# Patient Record
Sex: Female | Born: 1970 | Race: White | Hispanic: No | Marital: Married | Smoking: Current every day smoker
Health system: Southern US, Community
[De-identification: ages and names within clinical notes are randomized; demographics above are authoritative.]

## PROBLEM LIST (undated history)

## (undated) DIAGNOSIS — Z9889 Other specified postprocedural states: Secondary | ICD-10-CM

## (undated) DIAGNOSIS — R112 Nausea with vomiting, unspecified: Secondary | ICD-10-CM

## (undated) DIAGNOSIS — E669 Obesity, unspecified: Secondary | ICD-10-CM

## (undated) DIAGNOSIS — M1990 Unspecified osteoarthritis, unspecified site: Secondary | ICD-10-CM

## (undated) DIAGNOSIS — Z5189 Encounter for other specified aftercare: Secondary | ICD-10-CM

## (undated) DIAGNOSIS — E119 Type 2 diabetes mellitus without complications: Secondary | ICD-10-CM

## (undated) HISTORY — DX: Encounter for other specified aftercare: Z51.89

## (undated) HISTORY — PX: TUBAL LIGATION: SHX77

## (undated) HISTORY — PX: FRACTURE SURGERY: SHX138

## (undated) HISTORY — DX: Unspecified osteoarthritis, unspecified site: M19.90

## (undated) HISTORY — DX: Obesity, unspecified: E66.9

---
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
## 1998-02-21 DIAGNOSIS — G4700 Insomnia, unspecified: Secondary | ICD-10-CM | POA: Insufficient documentation

## 1998-02-21 DIAGNOSIS — F419 Anxiety disorder, unspecified: Secondary | ICD-10-CM | POA: Insufficient documentation

## 2012-01-30 ENCOUNTER — Other Ambulatory Visit: Payer: Self-pay | Admitting: Obstetrics & Gynecology

## 2012-01-30 ENCOUNTER — Ambulatory Visit (INDEPENDENT_AMBULATORY_CARE_PROVIDER_SITE_OTHER): Payer: 59 | Admitting: Obstetrics & Gynecology

## 2012-01-30 ENCOUNTER — Encounter: Payer: Self-pay | Admitting: Obstetrics & Gynecology

## 2012-01-30 VITALS — BP 136/86 | HR 84 | Ht 64.0 in | Wt 176.0 lb

## 2012-01-30 DIAGNOSIS — Z0000 Encounter for general adult medical examination without abnormal findings: Secondary | ICD-10-CM

## 2012-01-30 DIAGNOSIS — Z1231 Encounter for screening mammogram for malignant neoplasm of breast: Secondary | ICD-10-CM

## 2012-01-30 DIAGNOSIS — Z01419 Encounter for gynecological examination (general) (routine) without abnormal findings: Secondary | ICD-10-CM

## 2012-01-30 NOTE — Progress Notes (Signed)
Subjective:    Nancy Wood is a 41 y.o. female who presents for an annual exam. The patient has no complaints today. The patient is sexually active. GYN screening history: last pap: was normal. The patient wears seatbelts: yes. The patient participates in regular exercise: not asked. Has the patient ever been transfused or tattooed?: no. The patient reports that there is not domestic violence in her life.   Menstrual History: OB History    Grav Para Term Preterm Abortions TAB SAB Ect Mult Living   4    1  1   3       Menarche age: 60 Patient's last menstrual period was 01/25/2012.    The following portions of the patient's history were reviewed and updated as appropriate: allergies, current medications, past family history, past medical history, past social history, past surgical history and problem list.  Review of Systems A comprehensive review of systems was negative. She has been married for 16 years, monogamous for 20. She denies dyspareunia. She works at [employer].   Objective:    BP 136/86  Pulse 84  Ht 5\' 4"  (1.626 m)  Wt 176 lb (79.833 kg)  BMI 30.21 kg/m2  LMP 01/25/2012  General Appearance:    Alert, cooperative, no distress, appears stated age  Head:    Normocephalic, without obvious abnormality, atraumatic  Eyes:    PERRL, conjunctiva/corneas clear, EOM's intact, fundi    benign, both eyes  Ears:    Normal TM's and external ear canals, both ears  Nose:   Nares normal, septum midline, mucosa normal, no drainage    or sinus tenderness  Throat:   Lips, mucosa, and tongue normal; teeth and gums normal  Neck:   Supple, symmetrical, trachea midline, no adenopathy;    thyroid:  no enlargement/tenderness/nodules; no carotid   bruit or JVD  Back:     Symmetric, no curvature, ROM normal, no CVA tenderness  Lungs:     Clear to auscultation bilaterally, respirations unlabored  Chest Wall:    No tenderness or deformity   Heart:    Regular rate and rhythm, S1 and S2  normal, no murmur, rub   or gallop  Breast Exam:    No tenderness, masses, or nipple abnormality  Abdomen:     Soft, non-tender, bowel sounds active all four quadrants,    no masses, no organomegaly  Genitalia:    Normal female without lesion, discharge or tenderness, shaved, NSSA, NT, mobile, normal adnexa     Extremities:   Extremities normal, atraumatic, no cyanosis or edema  Pulses:   2+ and symmetric all extremities  Skin:   Skin color, texture, turgor normal, no rashes or lesions  Lymph nodes:   Cervical, supraclavicular, and axillary nodes normal  Neurologic:   CNII-XII intact, normal strength, sensation and reflexes    throughout  .    Assessment:    Healthy female exam.  Strong FH of breast cancer   Plan:     Mammogram. Thin prep Pap smear. BRCA testing  She declines a flu vaccine.

## 2012-02-13 LAB — PAP LB AND HPV HIGH-RISK
HPV, high-risk: NEGATIVE
PAP Smear Comment: 0

## 2012-03-05 ENCOUNTER — Ambulatory Visit: Payer: 59 | Admitting: Obstetrics & Gynecology

## 2012-03-19 ENCOUNTER — Ambulatory Visit (HOSPITAL_COMMUNITY)
Admission: RE | Admit: 2012-03-19 | Discharge: 2012-03-19 | Disposition: A | Discharge status: Home / Self Care | Payer: 59 | Source: Ambulatory Visit | Attending: Obstetrics & Gynecology | Admitting: Obstetrics & Gynecology

## 2012-03-19 DIAGNOSIS — Z0000 Encounter for general adult medical examination without abnormal findings: Secondary | ICD-10-CM

## 2012-03-19 DIAGNOSIS — Z1231 Encounter for screening mammogram for malignant neoplasm of breast: Secondary | ICD-10-CM | POA: Insufficient documentation

## 2012-04-07 ENCOUNTER — Other Ambulatory Visit: Payer: Self-pay

## 2012-12-27 ENCOUNTER — Other Ambulatory Visit: Payer: Self-pay

## 2013-12-06 ENCOUNTER — Other Ambulatory Visit: Payer: Self-pay

## 2013-12-18 LAB — LIPID PANEL
Cholesterol: 201 mg/dL — AB (ref 0–200)
HDL: 34 mg/dL — AB (ref 35–70)
LDL Cholesterol: 94 mg/dL
Triglycerides: 364 mg/dL — AB (ref 40–160)

## 2013-12-23 ENCOUNTER — Encounter: Payer: Self-pay | Admitting: Obstetrics & Gynecology

## 2013-12-26 ENCOUNTER — Other Ambulatory Visit: Payer: Self-pay | Admitting: Orthopaedic Surgery

## 2013-12-27 ENCOUNTER — Other Ambulatory Visit (HOSPITAL_COMMUNITY): Payer: Self-pay | Admitting: Orthopedic Surgery

## 2013-12-27 DIAGNOSIS — M25552 Pain in left hip: Secondary | ICD-10-CM

## 2014-01-01 ENCOUNTER — Ambulatory Visit (HOSPITAL_COMMUNITY)
Admission: RE | Admit: 2014-01-01 | Discharge: 2014-01-01 | Disposition: A | Discharge status: Home / Self Care | Payer: 59 | Source: Ambulatory Visit | Attending: Orthopedic Surgery | Admitting: Orthopedic Surgery

## 2014-01-01 ENCOUNTER — Other Ambulatory Visit (HOSPITAL_COMMUNITY): Payer: Self-pay | Admitting: Orthopedic Surgery

## 2014-01-01 DIAGNOSIS — M25552 Pain in left hip: Secondary | ICD-10-CM

## 2014-01-01 DIAGNOSIS — Z01818 Encounter for other preprocedural examination: Secondary | ICD-10-CM | POA: Diagnosis present

## 2014-01-06 ENCOUNTER — Encounter (HOSPITAL_COMMUNITY)
Admission: RE | Admit: 2014-01-06 | Discharge: 2014-01-06 | Disposition: A | Discharge status: Home / Self Care | Payer: 59 | Source: Ambulatory Visit | Attending: Orthopedic Surgery | Admitting: Orthopedic Surgery

## 2014-01-06 ENCOUNTER — Encounter (HOSPITAL_COMMUNITY): Payer: Self-pay

## 2014-01-06 ENCOUNTER — Ambulatory Visit (HOSPITAL_COMMUNITY)
Admission: RE | Admit: 2014-01-06 | Discharge: 2014-01-06 | Disposition: A | Discharge status: Home / Self Care | Payer: 59 | Source: Ambulatory Visit | Attending: Orthopedic Surgery | Admitting: Orthopedic Surgery

## 2014-01-06 DIAGNOSIS — M12552 Traumatic arthropathy, left hip: Secondary | ICD-10-CM | POA: Insufficient documentation

## 2014-01-06 DIAGNOSIS — Z01818 Encounter for other preprocedural examination: Secondary | ICD-10-CM | POA: Insufficient documentation

## 2014-01-06 HISTORY — DX: Nausea with vomiting, unspecified: R11.2

## 2014-01-06 HISTORY — DX: Other specified postprocedural states: Z98.890

## 2014-01-06 HISTORY — DX: Type 2 diabetes mellitus without complications: E11.9

## 2014-01-06 LAB — CBC WITH DIFFERENTIAL/PLATELET
BASOS ABS: 0.1 10*3/uL (ref 0.0–0.1)
Basophils Relative: 1 % (ref 0–1)
EOS PCT: 3 % (ref 0–5)
Eosinophils Absolute: 0.2 10*3/uL (ref 0.0–0.7)
HCT: 42.7 % (ref 36.0–46.0)
Hemoglobin: 14.5 g/dL (ref 12.0–15.0)
Lymphocytes Relative: 40 % (ref 12–46)
Lymphs Abs: 2.8 10*3/uL (ref 0.7–4.0)
MCH: 28.1 pg (ref 26.0–34.0)
MCHC: 34 g/dL (ref 30.0–36.0)
MCV: 82.8 fL (ref 78.0–100.0)
MONO ABS: 0.3 10*3/uL (ref 0.1–1.0)
Monocytes Relative: 4 % (ref 3–12)
NEUTROS ABS: 3.7 10*3/uL (ref 1.7–7.7)
Neutrophils Relative %: 52 % (ref 43–77)
Platelets: 284 10*3/uL (ref 150–400)
RBC: 5.16 MIL/uL — ABNORMAL HIGH (ref 3.87–5.11)
RDW: 13 % (ref 11.5–15.5)
WBC: 7 10*3/uL (ref 4.0–10.5)

## 2014-01-06 LAB — BASIC METABOLIC PANEL
ANION GAP: 17 — AB (ref 5–15)
BUN: 10 mg/dL (ref 6–23)
CALCIUM: 9.8 mg/dL (ref 8.4–10.5)
CO2: 18 mEq/L — ABNORMAL LOW (ref 19–32)
CREATININE: 0.69 mg/dL (ref 0.50–1.10)
Chloride: 102 mEq/L (ref 96–112)
GFR calc Af Amer: >90 mL/min (ref >90)
GFR calc non Af Amer: >90 mL/min (ref >90)
Glucose, Bld: 85 mg/dL (ref 70–99)
Potassium: 4.1 mEq/L (ref 3.7–5.3)
Sodium: 137 mEq/L (ref 137–147)

## 2014-01-06 LAB — APTT: aPTT: 29 seconds (ref 24–37)

## 2014-01-06 LAB — SURGICAL PCR SCREEN
MRSA, PCR: NEGATIVE
STAPHYLOCOCCUS AUREUS: POSITIVE — AB

## 2014-01-06 LAB — TYPE AND SCREEN
ABO/RH(D): A NEG
Antibody Screen: NEGATIVE

## 2014-01-06 LAB — PROTIME-INR
INR: 0.96 (ref 0.00–1.49)
PROTHROMBIN TIME: 12.9 s (ref 11.6–15.2)

## 2014-01-06 LAB — HCG, SERUM, QUALITATIVE: PREG SERUM: NEGATIVE

## 2014-01-06 LAB — ABO/RH: ABO/RH(D): A NEG

## 2014-01-06 NOTE — Progress Notes (Signed)
I called a prescription for Mupirocin ointment to [facility].

## 2014-01-07 LAB — URINE CULTURE
COLONY COUNT: NO GROWTH
CULTURE: NO GROWTH
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
## 2014-01-12 DIAGNOSIS — M12552 Traumatic arthropathy, left hip: Secondary | ICD-10-CM | POA: Diagnosis present

## 2014-01-12 MED ORDER — KCL IN DEXTROSE-NACL 20-5-0.2 MEQ/L-%-% IV SOLN
INTRAVENOUS | Status: DC
  Filled 2014-01-12 (×2): qty 1000

## 2014-01-12 MED ORDER — CEFAZOLIN SODIUM-DEXTROSE 2-3 GM-% IV SOLR
2.0000 g | INTRAVENOUS | Status: DC
  Filled 2014-01-12: qty 50

## 2014-01-13 ENCOUNTER — Encounter (HOSPITAL_COMMUNITY): Payer: Self-pay | Admitting: *Deleted

## 2014-01-13 ENCOUNTER — Encounter (HOSPITAL_COMMUNITY)
Admission: RE | Disposition: A | Discharge status: Home / Self Care | Payer: Self-pay | Source: Ambulatory Visit | Attending: Orthopedic Surgery

## 2014-01-13 ENCOUNTER — Inpatient Hospital Stay (HOSPITAL_COMMUNITY): Payer: 59

## 2014-01-13 ENCOUNTER — Inpatient Hospital Stay (HOSPITAL_COMMUNITY): Payer: 59 | Admitting: Anesthesiology

## 2014-01-13 ENCOUNTER — Inpatient Hospital Stay (HOSPITAL_COMMUNITY)
Admission: RE | Admit: 2014-01-13 | Discharge: 2014-01-15 | DRG: 470 | Disposition: A | Discharge status: Home / Self Care | Payer: 59 | Source: Ambulatory Visit | Attending: Orthopedic Surgery | Admitting: Orthopedic Surgery

## 2014-01-13 DIAGNOSIS — E669 Obesity, unspecified: Secondary | ICD-10-CM | POA: Diagnosis present

## 2014-01-13 DIAGNOSIS — M12552 Traumatic arthropathy, left hip: Secondary | ICD-10-CM | POA: Diagnosis present

## 2014-01-13 DIAGNOSIS — Z6830 Body mass index (BMI) 30.0-30.9, adult: Secondary | ICD-10-CM | POA: Diagnosis not present

## 2014-01-13 DIAGNOSIS — E119 Type 2 diabetes mellitus without complications: Secondary | ICD-10-CM | POA: Diagnosis present

## 2014-01-13 DIAGNOSIS — Z720 Tobacco use: Secondary | ICD-10-CM | POA: Diagnosis not present

## 2014-01-13 DIAGNOSIS — Z833 Family history of diabetes mellitus: Secondary | ICD-10-CM | POA: Diagnosis not present

## 2014-01-13 DIAGNOSIS — F419 Anxiety disorder, unspecified: Secondary | ICD-10-CM | POA: Diagnosis present

## 2014-01-13 DIAGNOSIS — Z808 Family history of malignant neoplasm of other organs or systems: Secondary | ICD-10-CM

## 2014-01-13 DIAGNOSIS — M1652 Unilateral post-traumatic osteoarthritis, left hip: Secondary | ICD-10-CM | POA: Diagnosis present

## 2014-01-13 DIAGNOSIS — M12559 Traumatic arthropathy, unspecified hip: Secondary | ICD-10-CM | POA: Diagnosis present

## 2014-01-13 DIAGNOSIS — Z803 Family history of malignant neoplasm of breast: Secondary | ICD-10-CM | POA: Diagnosis not present

## 2014-01-13 DIAGNOSIS — M1612 Unilateral primary osteoarthritis, left hip: Secondary | ICD-10-CM

## 2014-01-13 HISTORY — PX: TOTAL HIP ARTHROPLASTY: SHX124

## 2014-01-13 LAB — GLUCOSE, CAPILLARY
GLUCOSE-CAPILLARY: 221 mg/dL — AB (ref 70–99)
GLUCOSE-CAPILLARY: 136 mg/dL — AB (ref 70–99)
Glucose-Capillary: 126 mg/dL — ABNORMAL HIGH (ref 70–99)
Glucose-Capillary: 126 mg/dL — ABNORMAL HIGH (ref 70–99)
Glucose-Capillary: 213 mg/dL — ABNORMAL HIGH (ref 70–99)

## 2014-01-13 LAB — HEMOGLOBIN A1C
Hgb A1c MFr Bld: 6.3 % — ABNORMAL HIGH (ref <5.7)
MEAN PLASMA GLUCOSE: 134 mg/dL — AB (ref <117)

## 2014-01-13 SURGERY — ARTHROPLASTY, HIP, TOTAL,POSTERIOR APPROACH
Anesthesia: Monitor Anesthesia Care | Site: Hip | Laterality: Left

## 2014-01-13 MED ORDER — OXYCODONE HCL 5 MG PO TABS: 5.0000 mg | ORAL_TABLET | Freq: Once | ORAL | Status: DC | PRN

## 2014-01-13 MED ORDER — OXYCODONE HCL 5 MG PO TABS
5.0000 mg | ORAL_TABLET | ORAL | Status: DC | PRN
  Administered 2014-01-13 – 2014-01-15 (×13): 10 mg via ORAL
  Filled 2014-01-13 (×13): qty 2

## 2014-01-13 MED ORDER — BUPIVACAINE HCL (PF) 0.5 % IJ SOLN
INTRAMUSCULAR | Status: DC | PRN
  Administered 2014-01-13: 3 mL

## 2014-01-13 MED ORDER — ONDANSETRON HCL 4 MG/2ML IJ SOLN
INTRAMUSCULAR | Status: DC | PRN
  Administered 2014-01-13: 4 mg via INTRAVENOUS

## 2014-01-13 MED ORDER — PROPOFOL 10 MG/ML IV BOLUS
INTRAVENOUS | Status: AC
  Filled 2014-01-13: qty 20

## 2014-01-13 MED ORDER — CEFUROXIME SODIUM 1.5 G IJ SOLR
INTRAMUSCULAR | Status: AC
  Filled 2014-01-13: qty 1.5

## 2014-01-13 MED ORDER — PROPOFOL INFUSION 10 MG/ML OPTIME
INTRAVENOUS | Status: DC | PRN
  Administered 2014-01-13 (×2): via INTRAVENOUS
  Administered 2014-01-13: 75 ug/kg/min via INTRAVENOUS

## 2014-01-13 MED ORDER — PHENOL 1.4 % MT LIQD: 1.0000 | OROMUCOSAL | Status: DC | PRN

## 2014-01-13 MED ORDER — DEXTROSE 5 % IV SOLN
2.0000 g | Freq: Two times a day (BID) | INTRAVENOUS | Status: DC
  Filled 2014-01-13: qty 2

## 2014-01-13 MED ORDER — EPHEDRINE SULFATE 50 MG/ML IJ SOLN
INTRAMUSCULAR | Status: AC
  Filled 2014-01-13: qty 1

## 2014-01-13 MED ORDER — METHOCARBAMOL 1000 MG/10ML IJ SOLN
500.0000 mg | Freq: Four times a day (QID) | INTRAVENOUS | Status: DC | PRN
  Filled 2014-01-13: qty 5

## 2014-01-13 MED ORDER — ONDANSETRON HCL 4 MG/2ML IJ SOLN
INTRAMUSCULAR | Status: AC
  Filled 2014-01-13: qty 2

## 2014-01-13 MED ORDER — PHENYLEPHRINE HCL 10 MG/ML IJ SOLN
10.0000 mg | INTRAVENOUS | Status: DC | PRN
  Administered 2014-01-13: 20 ug/min via INTRAVENOUS

## 2014-01-13 MED ORDER — KCL IN DEXTROSE-NACL 20-5-0.45 MEQ/L-%-% IV SOLN
INTRAVENOUS | Status: DC
  Administered 2014-01-13: 16:00:00 via INTRAVENOUS
  Filled 2014-01-13 (×8): qty 1000

## 2014-01-13 MED ORDER — METHOCARBAMOL 500 MG PO TABS
500.0000 mg | ORAL_TABLET | Freq: Two times a day (BID) | ORAL | Status: DC
Start: 2014-01-13 — End: 2014-11-26

## 2014-01-13 MED ORDER — LACTATED RINGERS IV SOLN
INTRAVENOUS | Status: DC | PRN
  Administered 2014-01-13 (×3): via INTRAVENOUS

## 2014-01-13 MED ORDER — BISACODYL 5 MG PO TBEC: 5.0000 mg | DELAYED_RELEASE_TABLET | Freq: Every day | ORAL | Status: DC | PRN

## 2014-01-13 MED ORDER — FENTANYL CITRATE 0.05 MG/ML IJ SOLN
INTRAMUSCULAR | Status: AC
  Filled 2014-01-13: qty 5

## 2014-01-13 MED ORDER — SODIUM CHLORIDE 0.9 % IR SOLN
Status: DC | PRN
  Administered 2014-01-13: 1000 mL

## 2014-01-13 MED ORDER — METFORMIN HCL ER 500 MG PO TB24
500.0000 mg | ORAL_TABLET | Freq: Every day | ORAL | Status: DC
  Administered 2014-01-13 – 2014-01-15 (×3): 500 mg via ORAL
  Filled 2014-01-13 (×3): qty 1

## 2014-01-13 MED ORDER — PROPOFOL 10 MG/ML IV BOLUS
INTRAVENOUS | Status: DC | PRN
  Administered 2014-01-13: 20 mg via INTRAVENOUS

## 2014-01-13 MED ORDER — HYDROMORPHONE HCL 1 MG/ML IJ SOLN
1.0000 mg | INTRAMUSCULAR | Status: DC | PRN
  Administered 2014-01-13: 1 mg via INTRAVENOUS
  Filled 2014-01-13: qty 1

## 2014-01-13 MED ORDER — SUCCINYLCHOLINE CHLORIDE 20 MG/ML IJ SOLN
INTRAMUSCULAR | Status: AC
  Filled 2014-01-13: qty 1

## 2014-01-13 MED ORDER — SODIUM CHLORIDE 0.9 % IJ SOLN
INTRAMUSCULAR | Status: AC
  Filled 2014-01-13: qty 10

## 2014-01-13 MED ORDER — KETOROLAC TROMETHAMINE 30 MG/ML IJ SOLN: 15.0000 mg | Freq: Once | INTRAMUSCULAR | Status: DC | PRN

## 2014-01-13 MED ORDER — MIDAZOLAM HCL 2 MG/2ML IJ SOLN
INTRAMUSCULAR | Status: AC
  Filled 2014-01-13: qty 2

## 2014-01-13 MED ORDER — HYDROMORPHONE HCL 1 MG/ML IJ SOLN: 0.2500 mg | INTRAMUSCULAR | Status: DC | PRN

## 2014-01-13 MED ORDER — OXYCODONE HCL 5 MG/5ML PO SOLN: 5.0000 mg | Freq: Once | ORAL | Status: DC | PRN

## 2014-01-13 MED ORDER — METOCLOPRAMIDE HCL 5 MG/ML IJ SOLN
5.0000 mg | Freq: Three times a day (TID) | INTRAMUSCULAR | Status: DC | PRN
  Administered 2014-01-14: 10 mg via INTRAVENOUS
  Filled 2014-01-13: qty 2

## 2014-01-13 MED ORDER — DOCUSATE SODIUM 100 MG PO CAPS
100.0000 mg | ORAL_CAPSULE | Freq: Two times a day (BID) | ORAL | Status: DC
  Administered 2014-01-13 – 2014-01-14 (×3): 100 mg via ORAL
  Filled 2014-01-13 (×5): qty 1

## 2014-01-13 MED ORDER — METHOCARBAMOL 500 MG PO TABS
500.0000 mg | ORAL_TABLET | Freq: Four times a day (QID) | ORAL | Status: DC | PRN
  Administered 2014-01-13 – 2014-01-15 (×6): 500 mg via ORAL
  Filled 2014-01-13 (×7): qty 1

## 2014-01-13 MED ORDER — ACETAMINOPHEN 650 MG RE SUPP: 650.0000 mg | Freq: Four times a day (QID) | RECTAL | Status: DC | PRN

## 2014-01-13 MED ORDER — MENTHOL 3 MG MT LOZG: 1.0000 | LOZENGE | OROMUCOSAL | Status: DC | PRN

## 2014-01-13 MED ORDER — CEFAZOLIN SODIUM-DEXTROSE 2-3 GM-% IV SOLR
2.0000 g | Freq: Once | INTRAVENOUS | Status: AC
  Administered 2014-01-13: 2 g via INTRAVENOUS

## 2014-01-13 MED ORDER — BUPIVACAINE-EPINEPHRINE (PF) 0.5% -1:200000 IJ SOLN
INTRAMUSCULAR | Status: AC
  Filled 2014-01-13: qty 30

## 2014-01-13 MED ORDER — ONDANSETRON HCL 4 MG/2ML IJ SOLN
4.0000 mg | Freq: Four times a day (QID) | INTRAMUSCULAR | Status: DC | PRN
  Administered 2014-01-13 – 2014-01-14 (×2): 4 mg via INTRAVENOUS
  Filled 2014-01-13 (×2): qty 2

## 2014-01-13 MED ORDER — ACETAMINOPHEN 325 MG PO TABS: 650.0000 mg | ORAL_TABLET | Freq: Four times a day (QID) | ORAL | Status: DC | PRN

## 2014-01-13 MED ORDER — SENNOSIDES-DOCUSATE SODIUM 8.6-50 MG PO TABS: 1.0000 | ORAL_TABLET | Freq: Every evening | ORAL | Status: DC | PRN

## 2014-01-13 MED ORDER — PHENYLEPHRINE HCL 10 MG/ML IJ SOLN
INTRAMUSCULAR | Status: DC | PRN
  Administered 2014-01-13 (×2): 80 ug via INTRAVENOUS
  Administered 2014-01-13 (×2): 40 ug via INTRAVENOUS

## 2014-01-13 MED ORDER — PHENYLEPHRINE HCL 10 MG/ML IJ SOLN
INTRAMUSCULAR | Status: AC
  Filled 2014-01-13: qty 1

## 2014-01-13 MED ORDER — MAGNESIUM CITRATE PO SOLN: 1.0000 | Freq: Once | ORAL | Status: AC | PRN

## 2014-01-13 MED ORDER — BUPIVACAINE-EPINEPHRINE 0.5% -1:200000 IJ SOLN
INTRAMUSCULAR | Status: DC | PRN
  Administered 2014-01-13: 20 mL

## 2014-01-13 MED ORDER — OXYCODONE-ACETAMINOPHEN 5-325 MG PO TABS: 1.0000 | ORAL_TABLET | ORAL | Status: DC | PRN

## 2014-01-13 MED ORDER — DEXAMETHASONE SODIUM PHOSPHATE 4 MG/ML IJ SOLN
INTRAMUSCULAR | Status: AC
  Filled 2014-01-13: qty 1

## 2014-01-13 MED ORDER — FENTANYL CITRATE 0.05 MG/ML IJ SOLN
INTRAMUSCULAR | Status: DC | PRN
  Administered 2014-01-13: 50 ug via INTRAVENOUS

## 2014-01-13 MED ORDER — TRANEXAMIC ACID 100 MG/ML IV SOLN
1000.0000 mg | INTRAVENOUS | Status: AC
  Administered 2014-01-13: 1000 mg via INTRAVENOUS
  Filled 2014-01-13: qty 10

## 2014-01-13 MED ORDER — ASPIRIN EC 325 MG PO TBEC: 325.0000 mg | DELAYED_RELEASE_TABLET | Freq: Two times a day (BID) | ORAL | Status: DC

## 2014-01-13 MED ORDER — ASPIRIN EC 325 MG PO TBEC
325.0000 mg | DELAYED_RELEASE_TABLET | Freq: Every day | ORAL | Status: DC
  Administered 2014-01-14 – 2014-01-15 (×2): 325 mg via ORAL
  Filled 2014-01-13 (×3): qty 1

## 2014-01-13 MED ORDER — METOCLOPRAMIDE HCL 10 MG PO TABS: 5.0000 mg | ORAL_TABLET | Freq: Three times a day (TID) | ORAL | Status: DC | PRN

## 2014-01-13 MED ORDER — ONDANSETRON HCL 4 MG PO TABS: 4.0000 mg | ORAL_TABLET | Freq: Four times a day (QID) | ORAL | Status: DC | PRN

## 2014-01-13 MED ORDER — MIDAZOLAM HCL 5 MG/5ML IJ SOLN
INTRAMUSCULAR | Status: DC | PRN
  Administered 2014-01-13: 2 mg via INTRAVENOUS

## 2014-01-13 MED ORDER — LIDOCAINE HCL (CARDIAC) 20 MG/ML IV SOLN
INTRAVENOUS | Status: AC
  Filled 2014-01-13: qty 5

## 2014-01-13 MED ORDER — PHENYLEPHRINE 40 MCG/ML (10ML) SYRINGE FOR IV PUSH (FOR BLOOD PRESSURE SUPPORT)
PREFILLED_SYRINGE | INTRAVENOUS | Status: AC
  Filled 2014-01-13: qty 10

## 2014-01-13 MED ORDER — DIPHENHYDRAMINE HCL 12.5 MG/5ML PO ELIX: 12.5000 mg | ORAL_SOLUTION | ORAL | Status: DC | PRN

## 2014-01-13 MED ORDER — ALPRAZOLAM 0.5 MG PO TABS: 1.0000 mg | ORAL_TABLET | Freq: Every evening | ORAL | Status: DC | PRN

## 2014-01-13 MED ORDER — GLYCOPYRROLATE 0.2 MG/ML IJ SOLN
INTRAMUSCULAR | Status: AC
  Filled 2014-01-13: qty 1

## 2014-01-13 MED ORDER — DEXAMETHASONE SODIUM PHOSPHATE 4 MG/ML IJ SOLN
INTRAMUSCULAR | Status: DC | PRN
  Administered 2014-01-13: 4 mg via INTRAVENOUS

## 2014-01-13 MED ORDER — LIDOCAINE HCL (CARDIAC) 20 MG/ML IV SOLN
INTRAVENOUS | Status: DC | PRN
  Administered 2014-01-13 (×2): 50 mg via INTRAVENOUS

## 2014-01-13 MED ORDER — ROCURONIUM BROMIDE 50 MG/5ML IV SOLN
INTRAVENOUS | Status: AC
  Filled 2014-01-13: qty 1

## 2014-01-13 SURGICAL SUPPLY — 55 items
BLADE SAW SGTL 18X1.27X75 (BLADE) ×2 IMPLANT
BRUSH FEMORAL CANAL (MISCELLANEOUS) IMPLANT
CAPT HIP PF COP ×1 IMPLANT
COVER BACK TABLE 24X17X13 BIG (DRAPES) IMPLANT
COVER SURGICAL LIGHT HANDLE (MISCELLANEOUS) ×4 IMPLANT
DRAPE IMP U-DRAPE 54X76 (DRAPES) ×2 IMPLANT
DRAPE ORTHO SPLIT 77X108 STRL (DRAPES) ×2
DRAPE PROXIMA HALF (DRAPES) ×2 IMPLANT
DRAPE SURG ORHT 6 SPLT 77X108 (DRAPES) ×1 IMPLANT
DRAPE U-SHAPE 47X51 STRL (DRAPES) ×2 IMPLANT
DRILL BIT 7/64X5 (BIT) ×2 IMPLANT
DRSG AQUACEL AG ADV 3.5X10 (GAUZE/BANDAGES/DRESSINGS) ×2 IMPLANT
DRSG MEPILEX BORDER 4X12 (GAUZE/BANDAGES/DRESSINGS) ×1 IMPLANT
DURAPREP 26ML APPLICATOR (WOUND CARE) ×2 IMPLANT
ELECT BLADE 4.0 EZ CLEAN MEGAD (MISCELLANEOUS)
ELECT REM PT RETURN 9FT ADLT (ELECTROSURGICAL) ×2
ELECTRODE BLDE 4.0 EZ CLN MEGD (MISCELLANEOUS) IMPLANT
ELECTRODE REM PT RTRN 9FT ADLT (ELECTROSURGICAL) ×1 IMPLANT
GAUZE XEROFORM 1X8 LF (GAUZE/BANDAGES/DRESSINGS) ×2 IMPLANT
GLOVE BIO SURGEON STRL SZ7.5 (GLOVE) ×2 IMPLANT
GLOVE BIO SURGEON STRL SZ8.5 (GLOVE) ×4 IMPLANT
GLOVE BIOGEL PI IND STRL 8 (GLOVE) ×2 IMPLANT
GLOVE BIOGEL PI IND STRL 9 (GLOVE) ×1 IMPLANT
GLOVE BIOGEL PI INDICATOR 8 (GLOVE) ×2
GLOVE BIOGEL PI INDICATOR 9 (GLOVE) ×1
GOWN STRL REUS W/ TWL LRG LVL3 (GOWN DISPOSABLE) ×2 IMPLANT
GOWN STRL REUS W/ TWL XL LVL3 (GOWN DISPOSABLE) ×3 IMPLANT
GOWN STRL REUS W/TWL LRG LVL3 (GOWN DISPOSABLE) ×4
GOWN STRL REUS W/TWL XL LVL3 (GOWN DISPOSABLE) ×6
HANDPIECE INTERPULSE COAX TIP (DISPOSABLE)
HOOD PEEL AWAY FACE SHEILD DIS (HOOD) ×4 IMPLANT
KIT BASIN OR (CUSTOM PROCEDURE TRAY) ×2 IMPLANT
KIT ROOM TURNOVER OR (KITS) ×2 IMPLANT
MANIFOLD NEPTUNE II (INSTRUMENTS) ×2 IMPLANT
NEEDLE 22X1 1/2 (OR ONLY) (NEEDLE) ×2 IMPLANT
NS IRRIG 1000ML POUR BTL (IV SOLUTION) ×2 IMPLANT
PACK TOTAL JOINT (CUSTOM PROCEDURE TRAY) ×2 IMPLANT
PACK UNIVERSAL I (CUSTOM PROCEDURE TRAY) ×2 IMPLANT
PAD ARMBOARD 7.5X6 YLW CONV (MISCELLANEOUS) ×4 IMPLANT
PASSER SUT SWANSON 36MM LOOP (INSTRUMENTS) ×2 IMPLANT
PRESSURIZER FEMORAL UNIV (MISCELLANEOUS) IMPLANT
SET HNDPC FAN SPRY TIP SCT (DISPOSABLE) IMPLANT
SUT ETHIBOND 2 V 37 (SUTURE) ×2 IMPLANT
SUT VIC AB 0 CTB1 27 (SUTURE) ×2 IMPLANT
SUT VIC AB 1 CTX 36 (SUTURE) ×2
SUT VIC AB 1 CTX36XBRD ANBCTR (SUTURE) ×1 IMPLANT
SUT VIC AB 2-0 CTB1 (SUTURE) ×2 IMPLANT
SUT VIC AB 3-0 SH 27 (SUTURE) ×2
SUT VIC AB 3-0 SH 27X BRD (SUTURE) ×1 IMPLANT
SYR CONTROL 10ML LL (SYRINGE) ×2 IMPLANT
TOWEL OR 17X24 6PK STRL BLUE (TOWEL DISPOSABLE) ×2 IMPLANT
TOWEL OR 17X26 10 PK STRL BLUE (TOWEL DISPOSABLE) ×2 IMPLANT
TOWER CARTRIDGE SMART MIX (DISPOSABLE) IMPLANT
TRAY FOLEY CATH 14FR (SET/KITS/TRAYS/PACK) IMPLANT
WATER STERILE IRR 1000ML POUR (IV SOLUTION) ×8 IMPLANT
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
## 2014-01-13 NOTE — Plan of Care (Signed)
Problem: Phase I Progression Outcomes Goal: Dangle or out of bed evening of surgery Outcome: Completed/Met Date Met:  01/13/14

## 2014-01-13 NOTE — Transfer of Care (Signed)
Immediate Anesthesia Transfer of Care Note  Patient: Nancy Wood  Procedure(s) Performed: Procedure(s): LEFT TOTAL HIP ARTHROPLASTY/REMOVE PLATE AND SCREWS OF LEFT HIP (Left)  Patient Location: PACU  Anesthesia Type:MAC and Spinal  Level of Consciousness: awake, oriented and patient cooperative  Airway & Oxygen Therapy: Patient Spontanous Breathing  Post-op Assessment: Report given to PACU RN and Post -op Vital signs reviewed and stable  Post vital signs: Reviewed  Complications: No apparent anesthesia complications
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
## 2014-01-13 NOTE — Anesthesia Postprocedure Evaluation (Signed)
  Anesthesia Post-op Note  Patient: Nancy Wood  Procedure(s) Performed: Procedure(s): LEFT TOTAL HIP ARTHROPLASTY/REMOVE PLATE AND SCREWS OF LEFT HIP (Left)  Patient Location: PACU  Anesthesia Type:MAC and Spinal  Level of Consciousness: awake  Airway and Oxygen Therapy: Patient Spontanous Breathing  Post-op Pain: none  Post-op Assessment: Post-op Vital signs reviewed, Patient's Cardiovascular Status Stable, Respiratory Function Stable, Patent Airway, No signs of Nausea or vomiting and Pain level controlled  Post-op Vital Signs: Reviewed and stable  Last Vitals:  Filed Vitals:   01/13/14 1130  BP: 102/73  Pulse: 57  Temp: 36.7 C  Resp: 14    Complications: No apparent anesthesia complications

## 2014-01-13 NOTE — Anesthesia Preprocedure Evaluation (Addendum)
Anesthesia Evaluation  Patient identified by MRN, date of birth, ID band Patient awake    Reviewed: Allergy & Precautions, H&P , NPO status , Patient's Chart, lab work & pertinent test results  History of Anesthesia Complications Negative for: history of anesthetic complications  Airway Mallampati: II  TM Distance: >3 FB Neck ROM: Full    Dental  (+) Teeth Intact, Dental Advisory Given   Pulmonary neg sleep apnea, neg COPDneg recent URI, Current Smoker,          Cardiovascular negative cardio ROS  Rhythm:Regular Rate:Normal     Neuro/Psych PSYCHIATRIC DISORDERS Anxiety negative neurological ROS     GI/Hepatic negative GI ROS, Neg liver ROS,   Endo/Other  diabetes  Renal/GU negative Renal ROS     Musculoskeletal  (+) Arthritis -,   Abdominal   Peds  Hematology negative hematology ROS (+)   Anesthesia Other Findings   Reproductive/Obstetrics                            Anesthesia Physical Anesthesia Plan  ASA: II  Anesthesia Plan: MAC and Spinal   Post-op Pain Management:    Induction: Intravenous  Airway Management Planned: Natural Airway and Nasal Cannula  Additional Equipment: None  Intra-op Plan:   Post-operative Plan:   Informed Consent: I have reviewed the patients History and Physical, chart, labs and discussed the procedure including the risks, benefits and alternatives for the proposed anesthesia with the patient or authorized representative who has indicated his/her understanding and acceptance.   Dental advisory given  Plan Discussed with: CRNA and Surgeon  Anesthesia Plan Comments:         Anesthesia Quick Evaluation

## 2014-01-13 NOTE — Plan of Care (Signed)
Problem: Phase I Progression Outcomes Goal: CMS/Neurovascular status WDL Outcome: Completed/Met Date Met:  01/13/14 Goal: Pain controlled with appropriate interventions Outcome: Completed/Met Date Met:  01/13/14 Goal: Hemodynamically stable Outcome: Completed/Met Date Met:  01/13/14  Problem: Phase II Progression Outcomes Goal: Ambulates Outcome: Completed/Met Date Met:  01/13/14 Goal: Tolerating diet Outcome: Completed/Met Date Met:  01/13/14

## 2014-01-13 NOTE — Anesthesia Procedure Notes (Addendum)
Procedure Name: MAC Date/Time: 01/13/2014 7:45 AM Performed by: Jenne Campus Pre-anesthesia Checklist: Patient identified, Emergency Drugs available, Suction available, Patient being monitored and Timeout performed Patient Re-evaluated:Patient Re-evaluated prior to inductionOxygen Delivery Method: Simple face mask   Spinal Patient location during procedure: OR Staffing Anesthesiologist: Jarl Sellitto, CHRIS Preanesthetic Checklist Completed: patient identified, surgical consent, pre-op evaluation, timeout performed, IV checked, risks and benefits discussed and monitors and equipment checked Spinal Block Patient position: sitting Prep: site prepped and draped and DuraPrep Patient monitoring: heart rate, cardiac monitor, continuous pulse ox and blood pressure Approach: midline Location: L4-5 Injection technique: single-shot Needle Needle type: Pencan  Needle gauge: 24 G Needle length: 10 cm Assessment Sensory level: T10

## 2014-01-14 ENCOUNTER — Encounter (HOSPITAL_COMMUNITY): Payer: Self-pay | Admitting: Orthopedic Surgery

## 2014-01-14 LAB — CBC
HCT: 34.5 % — ABNORMAL LOW (ref 36.0–46.0)
HEMOGLOBIN: 11.5 g/dL — AB (ref 12.0–15.0)
MCH: 28.2 pg (ref 26.0–34.0)
MCHC: 33.3 g/dL (ref 30.0–36.0)
MCV: 84.6 fL (ref 78.0–100.0)
Platelets: 238 10*3/uL (ref 150–400)
RBC: 4.08 MIL/uL (ref 3.87–5.11)
RDW: 13.3 % (ref 11.5–15.5)
WBC: 9.2 10*3/uL (ref 4.0–10.5)

## 2014-01-14 LAB — BASIC METABOLIC PANEL
Anion gap: 13 (ref 5–15)
BUN: 8 mg/dL (ref 6–23)
CO2: 23 mEq/L (ref 19–32)
Calcium: 9 mg/dL (ref 8.4–10.5)
Chloride: 102 mEq/L (ref 96–112)
Creatinine, Ser: 0.76 mg/dL (ref 0.50–1.10)
GFR calc Af Amer: >90 mL/min (ref >90)
GFR calc non Af Amer: >90 mL/min (ref >90)
GLUCOSE: 153 mg/dL — AB (ref 70–99)
POTASSIUM: 4.2 meq/L (ref 3.7–5.3)
Sodium: 138 mEq/L (ref 137–147)

## 2014-01-14 LAB — GLUCOSE, CAPILLARY
Glucose-Capillary: 152 mg/dL — ABNORMAL HIGH (ref 70–99)
Glucose-Capillary: 127 mg/dL — ABNORMAL HIGH (ref 70–99)
Glucose-Capillary: 147 mg/dL — ABNORMAL HIGH (ref 70–99)
Glucose-Capillary: 132 mg/dL — ABNORMAL HIGH (ref 70–99)

## 2014-01-14 NOTE — Progress Notes (Signed)
Physical Therapy Treatment Patient Details Name: KESS MCILWAIN MRN: [id number] DOB: February 11, 1971 Today's Date: 01/14/2014    History of Present Illness Patient is a 43 yo female admitted 01/13/14 now s/p Lt THA - posterior precautions.   PMH:  Anxiety, arthritis, obesity, DM, h/o injury to Lt hip with prior surgery.    PT Comments    Patient continues to make great progress with therapy. Will practice steps again in AM and plan for DC after morning session.   Follow Up Recommendations  Home health PT;Supervision/Assistance - 24 hour     Equipment Recommendations  Rolling walker with 5" wheels;3in1 (PT)    Recommendations for Other Services       Precautions / Restrictions Precautions Precautions: Posterior Hip Precaution Comments: Patient able to recall all precautions Restrictions LLE Weight Bearing: Weight bearing as tolerated    Mobility  Bed Mobility Overal bed mobility: Needs Assistance Bed Mobility: Sit to Supine       Sit to supine: Min assist   General bed mobility comments: up in recliner before and after session  Transfers Overall transfer level: Needs assistance Equipment used: Rolling walker (2 wheeled) Transfers: Sit to/from Stand Sit to Stand: Supervision         General transfer comment: Patient with safe technique  Ambulation/Gait Ambulation/Gait assistance: Supervision Ambulation Distance (Feet): 300 Feet Assistive device: Rolling walker (2 wheeled) Gait Pattern/deviations: Step-through pattern;Decreased stride length Gait velocity: decreased Gait velocity interpretation: Below normal speed for age/gender General Gait Details: Cues to increase weight through LEs and not lean on RW   Stairs Stairs: Yes Stairs assistance: Min guard Stair Management: Step to pattern;Forwards;Two rails Number of Stairs: 5 General stair comments: Cues for sequency and technique  Wheelchair Mobility    Modified Rankin (Stroke Patients Only)        Balance                                    Cognition Arousal/Alertness: Awake/alert Behavior During Therapy: WFL for tasks assessed/performed Overall Cognitive Status: Within Functional Limits for tasks assessed                      Exercises Total Joint Exercises Quad Sets: AROM;Left;10 reps Gluteal Sets: AROM;Both;10 reps Heel Slides: AAROM;Left;10 reps Hip ABduction/ADduction: AAROM;Left;10 reps    General Comments        Pertinent Vitals/Pain Pain Score: 6  Pain Location: L hip Pain Descriptors / Indicators: Tightness;Sore Pain Intervention(s): Monitored during session    Home Living                      Prior Function            PT Goals (current goals can now be found in the care plan section) Progress towards PT goals: Progressing toward goals    Frequency  7X/week    PT Plan Current plan remains appropriate    Co-evaluation             End of Session Equipment Utilized During Treatment: Gait belt Activity Tolerance: Patient tolerated treatment well Patient left: in chair;with call bell/phone within reach     Time: 1417-1441 PT Time Calculation (min) (ACUTE ONLY): 24 min  Charges:  $Gait Training: 23-37 mins $Therapeutic Exercise: 8-22 mins                    G Codes:  Jacqualyn Posey 01/14/2014, 2:44 PM 01/14/2014 Jacqualyn Posey PTA [phone] pager [phone] office

## 2014-01-14 NOTE — Progress Notes (Signed)
Physical Therapy Treatment Patient Details Name: CHALET KERWIN MRN: [id number] DOB: 1970-11-08 Today's Date: 01/14/2014    History of Present Illness Patient is a 43 yo female admitted 01/13/14 now s/p Lt THA - posterior precautions.   PMH:  Anxiety, arthritis, obesity, DM, h/o injury to Lt hip with prior surgery.    PT Comments    Patient progressing well with ambulation and therex. Will plan to practice steps this afternoon. Anticipate patient will be ready for DC tomorrow AM based on progression and pain management  Follow Up Recommendations  Home health PT;Supervision/Assistance - 24 hour     Equipment Recommendations  Rolling walker with 5" wheels;3in1 (PT)    Recommendations for Other Services       Precautions / Restrictions Precautions Precautions: Posterior Hip Precaution Comments: Patient able to recall all precautions Restrictions LLE Weight Bearing: Weight bearing as tolerated    Mobility  Bed Mobility Overal bed mobility: Needs Assistance Bed Mobility: Sit to Supine       Sit to supine: Min assist   General bed mobility comments: Verbal cues for technique to maintain hip precautions.  Assist to move LLE onto bed  Transfers Overall transfer level: Needs assistance Equipment used: Rolling walker (2 wheeled)   Sit to Stand: Min guard         General transfer comment: Verbal cues for hand placement and technique.    Ambulation/Gait Ambulation/Gait assistance: Min guard Ambulation Distance (Feet): 125 Feet Assistive device: Rolling walker (2 wheeled) Gait Pattern/deviations: Step-through pattern;Decreased stride length   Gait velocity interpretation: Below normal speed for age/gender General Gait Details: Cues for gait seqence, posture and management of RW   Stairs            Wheelchair Mobility    Modified Rankin (Stroke Patients Only)       Balance                                    Cognition  Arousal/Alertness: Awake/alert Behavior During Therapy: WFL for tasks assessed/performed Overall Cognitive Status: Within Functional Limits for tasks assessed                      Exercises Total Joint Exercises Quad Sets: AROM;Left;10 reps Gluteal Sets: AROM;Both;10 reps Heel Slides: AAROM;Left;10 reps Hip ABduction/ADduction: AAROM;Left;10 reps    General Comments        Pertinent Vitals/Pain Pain Score: 7  Pain Location: l hip Pain Descriptors / Indicators: Sore;Tightness Pain Intervention(s): Monitored during session    Home Living                      Prior Function            PT Goals (current goals can now be found in the care plan section) Progress towards PT goals: Progressing toward goals    Frequency  7X/week    PT Plan Current plan remains appropriate    Co-evaluation             End of Session Equipment Utilized During Treatment: Gait belt Activity Tolerance: Patient tolerated treatment well Patient left: in bed;with call bell/phone within reach     Time: 0755-0827 PT Time Calculation (min) (ACUTE ONLY): 32 min  Charges:  $Gait Training: 8-22 mins $Therapeutic Exercise: 8-22 mins  G Codes:      Jacqualyn Posey 01/14/2014, 11:55 AM  01/14/2014 Jacqualyn Posey PTA [phone] pager [phone] office

## 2014-01-14 NOTE — Progress Notes (Signed)
Patient ID: Nancy Wood, female   DOB: 01-Apr-1970, 43 y.o.   MRN: [id number] PATIENT ID: Nancy Wood  MRN: [id number]  DOB/AGE:  05-May-1970 / 43 y.o.  1 Day Post-Op Procedure(s) (LRB): LEFT TOTAL HIP ARTHROPLASTY/REMOVE PLATE AND SCREWS OF LEFT HIP (Left)    PROGRESS NOTE Subjective: Patient is alert, oriented, x1 Nausea, no Vomiting, no passing gas, no Bowel Movement. Taking PO sips. Denies SOB, Chest or Calf Pain. Using Incentive Spirometer, PAS in place. Ambulate walked in room Patient reports pain as 4 on 0-10 scale  .    Objective: Vital signs in last 24 hours: Filed Vitals:   01/13/14 2212 01/14/14 0000 01/14/14 0400 01/14/14 0546  BP: 115/76   105/70  Pulse: 67   72  Temp: 98.3 F (36.8 C)   98.6 F (37 C)  TempSrc: Oral     Resp:  16 18   Height:      Weight:      SpO2: 100% 99% 98% 98%      Intake/Output from previous day: I/O last 3 completed shifts: In: 2300 [P.O.:300; I.V.:2000] Out: 200 [Blood:200]   Intake/Output this shift:     LABORATORY DATA:  Recent Labs  01/13/14 1812 01/13/14 2157 01/14/14 0500 01/14/14 0631  WBC  --   --  9.2  --   HGB  --   --  11.5*  --   HCT  --   --  34.5*  --   PLT  --   --  238  --   NA  --   --  138  --   K  --   --  4.2  --   CL  --   --  102  --   CO2  --   --  23  --   BUN  --   --  8  --   CREATININE  --   --  0.76  --   GLUCOSE  --   --  153*  --   GLUCAP 221* 136*  --  152*  CALCIUM  --   --  9.0  --     Examination: Neurologically intact ABD soft Neurovascular intact Sensation intact distally Intact pulses distally Dorsiflexion/Plantar flexion intact Incision: scant drainage No cellulitis present Compartment soft} XR AP&Lat of hip shows well placed\fixed THA  Assessment:   1 Day Post-Op Procedure(s) (LRB): LEFT TOTAL HIP ARTHROPLASTY/REMOVE PLATE AND SCREWS OF LEFT HIP (Left) ADDITIONAL DIAGNOSIS:  Expected Acute Blood Loss Anemia, Diabetes  Plan: PT/OT WBAT, THA  posterior  precautions  DVT Prophylaxis: SCDx72 hrs, ASA 325 mg BID x 2 weeks  DISCHARGE PLAN: Home, prob tomorrow  DISCHARGE NEEDS: HHPT, HHRN, CPM, Walker and 3-in-1 comode seat

## 2014-01-14 NOTE — Plan of Care (Signed)
Problem: Consults Goal: Total Joint Replacement Patient Education See Patient Education Module for education specifics.  Outcome: Completed/Met Date Met:  01/14/14 Goal: Diagnosis- Total Joint Replacement Outcome: Completed/Met Date Met:  01/14/14 Hemiarthroplasty Goal: Skin Care Protocol Initiated - if Braden Score 18 or less If consults are not indicated, leave blank or document N/A  Outcome: Completed/Met Date Met:  01/14/14  Problem: Phase II Progression Outcomes Goal: Discharge plan established Outcome: Completed/Met Date Met:  01/14/14 Goal: Other Phase II Outcomes/Goals Outcome: Completed/Met Date Met:  01/14/14  Problem: Phase III Progression Outcomes Goal: Pain controlled on oral analgesia Outcome: Completed/Met Date Met:  01/14/14 Goal: Ambulates Outcome: Completed/Met Date Met:  01/14/14 Goal: Incision clean - minimal/no drainage Outcome: Completed/Met Date Met:  01/14/14

## 2014-01-14 NOTE — Progress Notes (Signed)
Occupational Therapy Evaluation Patient Details Name: Nancy Wood MRN: [id number] DOB: 1970-05-05 Today's Date: 01/14/2014    History of Present Illness Patient is a 43 yo female admitted 01/13/14 now s/p Lt THA - posterior precautions.   PMH:  Anxiety, arthritis, obesity, DM, h/o injury to Lt hip with prior surgery.   Clinical Impression   PTA pt lived at home and was independent with use of [facility] for ADLs, however reports difficulty with donning socks and shoes. Pt currently requires (A) for LB ADLs due to hip precautions and pain. Pt will have 24/7 assistance at home. Pt will benefit from additional acute OT session for practice with AE for LB dressing prior to d/c home.     Follow Up Recommendations  No OT follow up;Supervision/Assistance - 24 hour    Equipment Recommendations  3 in 1 bedside comode    Recommendations for Other Services       Precautions / Restrictions Precautions Precautions: Posterior Hip Precaution Booklet Issued: Yes (comment) Precaution Comments: Patient able to recall all precautions Restrictions Weight Bearing Restrictions: Yes LLE Weight Bearing: Weight bearing as tolerated      Mobility Bed Mobility               General bed mobility comments: Pt sitting up in recliner when OT arrived.   Transfers Overall transfer level: Needs assistance Equipment used: Rolling walker (2 wheeled) Transfers: Sit to/from Stand Sit to Stand: Supervision         General transfer comment: Patient with safe technique         ADL Overall ADL's : Needs assistance/impaired Eating/Feeding: Independent;Sitting   Grooming: Min guard;Standing   Upper Body Bathing: Set up;Sitting   Lower Body Bathing: Minimal assistance;Sit to/from stand   Upper Body Dressing : Set up;Sitting   Lower Body Dressing: Moderate assistance;Sit to/from stand   Toilet Transfer: Supervision/safety;Ambulation;RW (sit<>stand from recliner)         Tub/Shower  Transfer Details (indicate cue type and reason): educated pt on both shower and tub transfers with use of RW and 3N1. Pt plans to sponge bathe until she feels comfortable with transfers.  Functional mobility during ADLs: Supervision/safety;Rolling walker General ADL Comments: Pt limited in LB ADLs due to pain and hip precautions. Educated pt on use of AE for LB ADLs. Pt states that she will have her husband purchase hip kit from the gift shop tonight. OT to possibly practice tomorrow before d/c.      Vision  Pt reports no change from baseline.                    Perception Perception Perception Tested?: No   Praxis Praxis Praxis tested?: Within functional limits    Pertinent Vitals/Pain Pain Assessment: 0-10 Pain Score: 4  Pain Location: L hip (worse when sitting edge of chair) Pain Descriptors / Indicators: Tightness;Sore Pain Intervention(s): Monitored during session;Repositioned;Ice applied     Hand Dominance     Extremity/Trunk Assessment Upper Extremity Assessment Upper Extremity Assessment: Overall WFL for tasks assessed   Lower Extremity Assessment Lower Extremity Assessment: Defer to PT evaluation   Cervical / Trunk Assessment Cervical / Trunk Assessment: Normal   Communication Communication Communication: No difficulties   Cognition Arousal/Alertness: Awake/alert Behavior During Therapy: WFL for tasks assessed/performed Overall Cognitive Status: Within Functional Limits for tasks assessed  Home Living Family/patient expects to be discharged to:: Private residence Living Arrangements: Spouse/significant other;Children (60 year old son) Available Help at Discharge: Family;Available 24 hours/day Type of Home: House Home Access: Stairs to enter [employer] of Steps: 3 steps no rail in back; 5 steps 1 rail front Entrance Stairs-Rails: Right;None Home Layout: One level     Bathroom Shower/Tub:  Tub/shower unit;Walk-in shower Shower/tub characteristics: Architectural technologist: Standard     Home Equipment: Cane - single point   Additional Comments: walk-in is a small stall that will not accommodate a shower seat/BSC.       Prior Functioning/Environment Level of Independence: Independent with assistive device(s)        Comments: Using cane pta due to pain. Does report prior difficulty with LB dressing.     OT Diagnosis: Generalized weakness;Acute pain   OT Problem List: Decreased strength;Decreased range of motion;Decreased activity tolerance;Impaired balance (sitting and/or standing);Decreased safety awareness;Decreased knowledge of use of DME or AE;Pain   OT Treatment/Interventions: Self-care/ADL training;Therapeutic exercise;Energy conservation;DME and/or AE instruction;Therapeutic activities;Patient/family education;Balance training    OT Goals(Current goals can be found in the care plan section) Acute Rehab OT Goals Patient Stated Goal: Home tomorrow OT Goal Formulation: With patient Time For Goal Achievement: 01/28/14 Potential to Achieve Goals: Good ADL Goals Pt Will Perform Grooming: with modified independence;standing Pt Will Perform Lower Body Bathing: with set-up;with supervision;with adaptive equipment;sit to/from stand Pt Will Perform Lower Body Dressing: with set-up;with supervision;with adaptive equipment;sit to/from stand Pt Will Transfer to Toilet: with modified independence;ambulating;bedside commode Pt Will Perform Toileting - Clothing Manipulation and hygiene: with modified independence;sit to/from stand  OT Frequency: Min 1X/week    End of Session Equipment Utilized During Treatment: Rolling walker  Activity Tolerance: Patient tolerated treatment well Patient left: in chair;with call bell/phone within reach;with family/visitor present   Time: 6073-7106 OT Time Calculation (min): 31 min Charges:  OT General Charges $OT Visit: 1 Procedure OT  Evaluation $Initial OT Evaluation Tier I: 1 Procedure OT Treatments $Self Care/Home Management : 8-22 mins $Therapeutic Activity: 8-22 mins  Juluis Rainier 01/14/2014, 3:57 PM   Secundino Ginger Lynetta Mare, OTR/L Occupational Therapist [phone] (pager)

## 2014-01-15 LAB — CBC
HEMATOCRIT: 33.5 % — AB (ref 36.0–46.0)
HEMOGLOBIN: 10.9 g/dL — AB (ref 12.0–15.0)
MCH: 27.9 pg (ref 26.0–34.0)
MCHC: 32.5 g/dL (ref 30.0–36.0)
MCV: 85.7 fL (ref 78.0–100.0)
Platelets: 234 10*3/uL (ref 150–400)
RBC: 3.91 MIL/uL (ref 3.87–5.11)
RDW: 13.4 % (ref 11.5–15.5)
WBC: 8.1 10*3/uL (ref 4.0–10.5)

## 2014-01-15 LAB — GLUCOSE, CAPILLARY: Glucose-Capillary: 148 mg/dL — ABNORMAL HIGH (ref 70–99)

## 2014-01-15 NOTE — Plan of Care (Signed)
Problem: Phase III Progression Outcomes Goal: Discharge plan remains appropriate-arrangements made Outcome: Completed/Met Date Met:  01/15/14     

## 2014-01-15 NOTE — Care Management Note (Signed)
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 Patient had a left total hip arthroplasty.     Action/Plan:   Case manager spoke with patient concerning home health and DME needs. Patient preoperatively setup with [facility], no changes. patient has family support at discharge.   Anticipated DC Date:  01/15/2014   Anticipated DC Plan:  [facility]  CM consult      [place]   Choice offered to / List presented to:  [name]   DME arranged  [place]  3-N-1      DME agency  [place] arranged  [place].   Status of service:  Completed, signed off Medicare Important Message given?   (If response is "NO", the following Medicare IM given date fields will be blank) Date Medicare IM given:   Medicare IM given by:   Date Additional Medicare IM given:   Additional Medicare IM given by:    Discharge Disposition:  [place]  Per UR Regulation:  Reviewed for med. necessity/level of care/duration of stay  If discussed at [place] of Stay Meetings, dates discussed:    Comments:

## 2014-01-15 NOTE — Progress Notes (Signed)
Occupational Therapy Treatment Patient Details Name: LACINDA CURVIN MRN: [id number] DOB: 08/31/1970 Today's Date: 01/15/2014    History of present illness Patient is a 43 yo female admitted 01/13/14 now s/p Lt THA - posterior precautions.   PMH:  Anxiety, arthritis, obesity, DM, h/o injury to Lt hip with prior surgery.   OT comments  Pt seen today for ADL session and to increase activity tolerance. Pt plans to d/c home today and is progressing very well. She will have 24/7 assistance. All OT goals met and education complete.    Follow Up Recommendations  No OT follow up;Supervision/Assistance - 24 hour    Equipment Recommendations  3 in 1 bedside comode    Recommendations for Other Services      Precautions / Restrictions Precautions Precautions: Posterior Hip Precaution Comments: Patient able to recall all precautions Restrictions Weight Bearing Restrictions: Yes LLE Weight Bearing: Weight bearing as tolerated       Mobility Bed Mobility Overal bed mobility: Modified Independent             General bed mobility comments: Pt sitting up in recliner when OT arrived.   Transfers Overall transfer level: Modified independent                        ADL Overall ADL's : Needs assistance/impaired     Grooming: Supervision/safety;Standing       Lower Body Bathing: Set up;Supervison/ safety;With adaptive equipment;Sit to/from stand       Lower Body Dressing: Set up;Supervision/safety;Sit to/from stand;With adaptive equipment               Functional mobility during ADLs: Supervision/safety;Rolling walker General ADL Comments: Pt's husband purchased AE hip kit and briefly discussed using for LB ADLs. Pt reports significant improvement with pain today and is mobilizing at Supervision level. Pt does c/o "swimmy-headed" feeling which is intermittent and discussed fall prevention including pausing after standing and making sure a seat is close by if she  feels light headed. Pt plans to shower today standing in small shower stall at home and discussed keeping the shower short, having husband nearby to supervise, and having assistance for transfer into/out of shower. Pt is safe for d/c from OT standpoint.                 Cognition   Behavior During Therapy: WFL for tasks assessed/performed Overall Cognitive Status: Within Functional Limits for tasks assessed                                    Pertinent Vitals/ Pain       Pain Assessment: No/denies pain         Frequency   N/A    Progress Toward Goals  OT Goals(current goals can now be found in the care plan section)  Progress towards OT goals: Goals met/education completed, patient discharged from [place] All goals met and education completed, patient discharged from OT services       End of Session Equipment Utilized During Treatment: Rolling walker   Activity Tolerance Patient tolerated treatment well   Patient Left in chair;with call bell/phone within reach;with family/visitor present   Nurse Communication          Time: 4158-3094 OT Time Calculation (min): 16 min  Charges: OT General Charges $OT Visit: 1 Procedure OT Treatments $Self Care/Home Management :  8-22 mins  Juluis Rainier 01/15/2014, 10:40 AM   Cyndie Chime, OTR/L Occupational Therapist [phone] (pager)

## 2014-01-15 NOTE — Progress Notes (Signed)
Patient provided with discharge instructions and follow up information. She is going home with HHPT through [place]. DME RW and 3-in- provided. She is going home at this time with her husband.
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
## 2014-01-15 NOTE — Progress Notes (Signed)
Went over discharge instructions with pt and husband. Prescriptions were given to the patient. IV was discontinued. All questions were answered. Pt had equipment to go home. The pt was taken out by wheelchair and transported home via private car.
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
## 2014-01-15 NOTE — Progress Notes (Signed)
PATIENT ID: Nancy Wood  MRN: [id number]  DOB/AGE:  December 24, 1970 / 43 y.o.  2 Days Post-Op Procedure(s) (LRB): LEFT TOTAL HIP ARTHROPLASTY/REMOVE PLATE AND SCREWS OF LEFT HIP (Left)    PROGRESS NOTE Subjective: Patient is alert, oriented, mild Nausea, no Vomiting, yes passing gas, no Bowel Movement. Taking PO sips. Denies SOB, Chest or Calf Pain. Using Incentive Spirometer, PAS in place. Ambulate WBAT Patient reports pain as 0 on 0-10 scale at rest, higher with acticvity .    Objective: Vital signs in last 24 hours: Filed Vitals:   01/14/14 1300 01/14/14 1600 01/14/14 2010 01/15/14 0617  BP: 94/66  97/52 117/66  Pulse: 69  84 83  Temp: 97.8 F (36.6 C)  99.8 F (37.7 C) 99 F (37.2 C)  TempSrc:   Oral Oral  Resp: 16 16 17 18   Height:      Weight:      SpO2: 100% 98% 98% 94%      Intake/Output from previous day: I/O last 3 completed shifts: In: 720 [P.O.:720] Out: -    Intake/Output this shift:     LABORATORY DATA:  Recent Labs  01/14/14 0500  01/14/14 1622 01/14/14 2154 01/15/14 0620 01/15/14 0645  WBC 9.2  --   --   --   --  8.1  HGB 11.5*  --   --   --   --  10.9*  HCT 34.5*  --   --   --   --  33.5*  PLT 238  --   --   --   --  234  NA 138  --   --   --   --   --   K 4.2  --   --   --   --   --   CL 102  --   --   --   --   --   CO2 23  --   --   --   --   --   BUN 8  --   --   --   --   --   CREATININE 0.76  --   --   --   --   --   GLUCOSE 153*  --   --   --   --   --   GLUCAP  --   < > 147* 132* 148*  --   CALCIUM 9.0  --   --   --   --   --   < > = values in this interval not displayed.  Examination: Neurologically intact Neurovascular intact Sensation intact distally Intact pulses distally Dorsiflexion/Plantar flexion intact Incision: scant drainage No cellulitis present Compartment soft} XR AP&Lat of hip shows well placed\fixed THA  Assessment:   2 Days Post-Op Procedure(s) (LRB): LEFT TOTAL HIP ARTHROPLASTY/REMOVE PLATE AND SCREWS  OF LEFT HIP (Left) ADDITIONAL DIAGNOSIS:  Expected Acute Blood Loss Anemia, Diabetes  Plan: PT/OT WBAT, THA  posterior precautions  DVT Prophylaxis: SCDx72 hrs, ASA 325 mg BID x 2 weeks  DISCHARGE PLAN: Home, later today.  DISCHARGE NEEDS: HHPT, HHRN, Walker and 3-in-1 comode seat

## 2014-05-28 LAB — HEMOGLOBIN A1C: Hgb A1c MFr Bld: 5.9 % (ref 4.0–6.0)

## 2014-07-26 ENCOUNTER — Emergency Department (HOSPITAL_COMMUNITY)
Admission: EM | Admit: 2014-07-26 | Discharge: 2014-07-26 | Disposition: A | Discharge status: Home / Self Care | Payer: 59 | Source: Home / Self Care | Attending: Emergency Medicine | Admitting: Emergency Medicine

## 2014-07-26 ENCOUNTER — Encounter (HOSPITAL_COMMUNITY): Payer: Self-pay | Admitting: Emergency Medicine

## 2014-07-26 DIAGNOSIS — L237 Allergic contact dermatitis due to plants, except food: Secondary | ICD-10-CM | POA: Diagnosis not present

## 2014-07-26 MED ORDER — PREDNISONE 20 MG PO TABS: ORAL_TABLET | ORAL | Status: DC

## 2014-07-26 MED ORDER — TRIAMCINOLONE ACETONIDE 0.1 % EX OINT: 1.0000 "application " | TOPICAL_OINTMENT | Freq: Two times a day (BID) | CUTANEOUS | Status: DC | PRN

## 2014-07-26 MED ORDER — METHYLPREDNISOLONE ACETATE 80 MG/ML IJ SUSP
INTRAMUSCULAR | Status: AC
  Filled 2014-07-26: qty 1

## 2014-07-26 MED ORDER — METHYLPREDNISOLONE ACETATE 80 MG/ML IJ SUSP
80.0000 mg | Freq: Once | INTRAMUSCULAR | Status: AC
  Administered 2014-07-26: 80 mg via INTRAMUSCULAR

## 2014-07-26 NOTE — ED Notes (Signed)
C/o poss poison ivy onset 7 days; states she was doing yard work Rash all over body and on face; close to eyes Denies fevers, chills; trying OTC poison ivy creams w/no relief.  Alert, no signs of acute distress

## 2014-07-26 NOTE — Discharge Instructions (Signed)
You have poison ivy. We gave you a shot of a steroid today. Start the prednisone taper tomorrow. Use the triamcinolone ointment twice a day as needed for itching. You can use over-the-counter Benadryl gel and calamine lotion as needed. Follow-up as needed.
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
## 2014-10-10 ENCOUNTER — Other Ambulatory Visit: Payer: Self-pay | Admitting: Family Medicine

## 2014-10-10 NOTE — Telephone Encounter (Signed)
Please call in alprazolam.  

## 2014-10-13 ENCOUNTER — Other Ambulatory Visit: Payer: Self-pay | Admitting: Family Medicine

## 2014-10-13 NOTE — Telephone Encounter (Signed)
Alprazolam RX called in at [facility] pharmacy.

## 2014-10-13 NOTE — Telephone Encounter (Signed)
Please call in alprazolam. Thanks.

## 2014-11-26 ENCOUNTER — Encounter: Payer: Self-pay | Admitting: Family Medicine

## 2014-11-26 ENCOUNTER — Ambulatory Visit (INDEPENDENT_AMBULATORY_CARE_PROVIDER_SITE_OTHER): Payer: 59 | Admitting: Family Medicine

## 2014-11-26 VITALS — BP 114/84 | HR 90 | Temp 99.5°F | Resp 16 | Ht 65.5 in | Wt 192.0 lb

## 2014-11-26 DIAGNOSIS — Z6831 Body mass index (BMI) 31.0-31.9, adult: Secondary | ICD-10-CM | POA: Diagnosis not present

## 2014-11-26 DIAGNOSIS — F17200 Nicotine dependence, unspecified, uncomplicated: Secondary | ICD-10-CM | POA: Diagnosis not present

## 2014-11-26 DIAGNOSIS — R7303 Prediabetes: Secondary | ICD-10-CM | POA: Diagnosis not present

## 2014-11-26 DIAGNOSIS — F419 Anxiety disorder, unspecified: Secondary | ICD-10-CM

## 2014-11-26 DIAGNOSIS — M1610 Unilateral primary osteoarthritis, unspecified hip: Secondary | ICD-10-CM | POA: Insufficient documentation

## 2014-11-26 DIAGNOSIS — E119 Type 2 diabetes mellitus without complications: Secondary | ICD-10-CM | POA: Insufficient documentation

## 2014-11-26 DIAGNOSIS — E781 Pure hyperglyceridemia: Secondary | ICD-10-CM | POA: Diagnosis not present

## 2014-11-26 LAB — POCT GLYCOSYLATED HEMOGLOBIN (HGB A1C)
Est. average glucose Bld gHb Est-mCnc: 134
HEMOGLOBIN A1C: 6.3

## 2014-11-26 NOTE — Progress Notes (Signed)
Patient: Nancy Wood Female    DOB: Aug 29, 1970   44 y.o.   MRN: [id number] Visit Date: 11/26/2014  Today's Provider: Lelon Huh, MD   Chief Complaint  Patient presents with  . Follow-up    Pre-diabetes  . Anxiety  . Nicotine Dependence   Subjective:    HPI   Prediabetes, Follow-up:   Lab Results  Component Value Date   HGBA1C 6.3 11/26/2014   HGBA1C 5.9 05/28/2014   HGBA1C 6.3* 01/13/2014   GLUCOSE 153* 01/14/2014   GLUCOSE 85 01/06/2014    Last seen for for this6 months ago.  Management since that visit includes continuing low glycemic index diet. Marland Kitchen She was on prednisone over the summer for severe poison ivy which caused to eat like crazy and put on weight. She is working on losing this with stricter diet and exercising more since the weather is cooling down.  Weight trend: increasing Prior visit with dietician: no   Pertinent Labs:    Component Value Date/Time   CHOL 201* 12/18/2013   TRIG 364* 12/18/2013   CREATININE 0.76 01/14/2014 0500    Wt Readings from Last 3 Encounters:  11/26/14 192 lb (87.091 kg)  01/13/14 183 lb (83.008 kg)  01/06/14 183 lb 6.8 oz (83.201 kg)     Follow up for anxiety  The patient was last seen for this 6 months ago. Changes made at last visit include continuing alprazolam.  She reports good compliance with treatment. She feels that condition is well controlled. . She is not having side effects.   ------------------------------------------------------------------------------------   Follow up for smoking cessation  She was prescribed Chantix but never filled prescription. She is working on cutting down and is smoking less than a pack a day. She understands health benefits of smoking cessation, but is not ready to quit.    ------------------------------------------------------------------------------------    Allergies  Allergen Reactions  . Vicodin [Hydrocodone-Acetaminophen] Nausea And Vomiting      5/325    Previous Medications   ALPRAZOLAM (XANAX) 1 MG TABLET    TAKE 1/2-1 TABLET BY MOUTH 3 TIMES A DAY AS NEEDED   ASPIRIN EC 325 MG TABLET    Take 1 tablet (325 mg total) by mouth 2 (two) times daily.    Review of Systems  Constitutional: Negative for fever, chills, appetite change and fatigue.  Respiratory: Negative for chest tightness and shortness of breath.   Cardiovascular: Negative for chest pain and palpitations.  Gastrointestinal: Negative for nausea, vomiting and abdominal pain.  Neurological: Negative for dizziness, weakness, light-headedness and headaches.    Social History  Substance Use Topics  . Smoking status: Current Every Day Smoker -- 1.00 packs/day for 22 years    Types: Cigarettes  . Smokeless tobacco: Not on file     Comment: per patient will try electronic ciarettes.  . Alcohol Use: No   Objective:   BP 114/84 mmHg  Pulse 90  Temp(Src) 99.5 F (37.5 C) (Oral)  Resp 16  Ht 5' 5.5" (1.664 m)  Wt 192 lb (87.091 kg)  BMI 31.45 kg/m2  SpO2 97%  Physical Exam  General appearance: alert, well developed, well nourished, cooperative and in no distress, overweight Head: Normocephalic, without obvious abnormality, atraumatic Lungs: Respirations even and unlabored Extremities: No gross deformities Skin: Skin color, texture, turgor normal. No rashes seen  Psych: Appropriate mood and affect. Neurologic: Mental status: Alert, oriented to person, place, and time, thought content appropriate.    Results for orders  placed or performed in visit on 11/26/14  POCT glycosylated hemoglobin (Hb A1C)  Result Value Ref Range   Hemoglobin A1C 6.3    Est. average glucose Bld gHb Est-mCnc 134        Assessment & Plan:     1. Borderline diabetes Sugars stable off metformin. She is to be more strict with diet as below.  - POCT glycosylated hemoglobin (Hb A1C)  2. Anxiety Doing well with current regiment of alprazolam  3. Hypertriglyceridemia  - Lipid  panel - TSH  4. BMI 31.0-31.9,adult Discussed reduce calories and increasing exercise to 150 minutes per week with goal of losing 4-5 pounds every month over the next year to reach a weight of 140-150 pounds.   5. Compulsive tobacco user syndrome Counseled regarding benefits of smoking cessation. She states she will call back for Chantix prescription when she is ready to quit.        Lelon Huh, MD  [facility]

## 2014-12-02 ENCOUNTER — Telehealth: Payer: Self-pay

## 2014-12-02 LAB — LIPID PANEL
Chol/HDL Ratio: 5.9 ratio units — ABNORMAL HIGH (ref 0.0–4.4)
Cholesterol, Total: 217 mg/dL — ABNORMAL HIGH (ref 100–199)
HDL: 37 mg/dL — AB (ref >39)
TRIGLYCERIDES: 491 mg/dL — AB (ref 0–149)

## 2014-12-02 LAB — TSH: TSH: 3.32 u[IU]/mL (ref 0.450–4.500)

## 2014-12-02 NOTE — Telephone Encounter (Signed)
Left message to call back  

## 2014-12-02 NOTE — Telephone Encounter (Signed)
-----   Message from Birdie Sons, MD sent at 12/02/2014  8:08 AM EDT ----- triglycerides are up to 491. Cholesterol is up to 217. Recommend OTC fish oil capsules 2000mg  twice a day. Follow up 4 months. May need prescription if not improved at follow up.  Thyroid functions are normal.

## 2014-12-04 ENCOUNTER — Telehealth: Payer: Self-pay | Admitting: Family Medicine

## 2014-12-04 NOTE — Telephone Encounter (Signed)
LMOVm for pt to return call.  

## 2014-12-04 NOTE — Telephone Encounter (Signed)
Left message to return call. See results note.

## 2014-12-04 NOTE — Telephone Encounter (Signed)
Pt returning call.  CB#[phone]/MW

## 2014-12-12 ENCOUNTER — Telehealth: Payer: Self-pay

## 2014-12-12 NOTE — Telephone Encounter (Signed)
t wanted to check on her BMI form for work. She would like to get it by Monday because she needs to mail it off to the company and they need it by OCtober 31st, she states she dropped it off a week ago she thinks.

## 2014-12-12 NOTE — Telephone Encounter (Signed)
Form is complete.

## 2014-12-12 NOTE — Telephone Encounter (Signed)
See result note.  

## 2014-12-12 NOTE — Telephone Encounter (Signed)
Left message notifying pt that form is ready.

## 2014-12-16 ENCOUNTER — Telehealth: Payer: Self-pay

## 2014-12-16 NOTE — Telephone Encounter (Signed)
-----   Message from Birdie Sons, MD sent at 12/02/2014  8:08 AM EDT ----- triglycerides are up to 491. Cholesterol is up to 217. Recommend OTC fish oil capsules 2000mg  twice a day. Follow up 4 months. May need prescription if not improved at follow up.  Thyroid functions are normal.

## 2014-12-16 NOTE — Telephone Encounter (Signed)
Left message to call back  

## 2014-12-16 NOTE — Telephone Encounter (Signed)
Patient was notified of results.  

## 2015-02-02 ENCOUNTER — Other Ambulatory Visit: Payer: Self-pay | Admitting: Family Medicine

## 2015-02-02 NOTE — Telephone Encounter (Signed)
Please call in alprazolam.  

## 2015-02-02 NOTE — Telephone Encounter (Signed)
Rx called in to pharmacy. 

## 2015-05-13 ENCOUNTER — Other Ambulatory Visit: Payer: Self-pay | Admitting: Family Medicine

## 2015-05-13 NOTE — Telephone Encounter (Signed)
Please call in alprazolam.  

## 2015-05-14 NOTE — Telephone Encounter (Signed)
Rx called in to pharmacy. 

## 2015-05-18 ENCOUNTER — Encounter: Payer: Self-pay | Admitting: Family Medicine

## 2015-05-18 ENCOUNTER — Ambulatory Visit (INDEPENDENT_AMBULATORY_CARE_PROVIDER_SITE_OTHER): Payer: 59 | Admitting: Family Medicine

## 2015-05-18 VITALS — BP 120/70 | HR 104 | Temp 101.2°F | Resp 16 | Ht 65.5 in | Wt 189.0 lb

## 2015-05-18 DIAGNOSIS — R509 Fever, unspecified: Secondary | ICD-10-CM

## 2015-05-18 DIAGNOSIS — J101 Influenza due to other identified influenza virus with other respiratory manifestations: Secondary | ICD-10-CM

## 2015-05-18 LAB — POCT INFLUENZA A/B
INFLUENZA A, POC: NEGATIVE
Influenza B, POC: POSITIVE — AB

## 2015-05-18 MED ORDER — OSELTAMIVIR PHOSPHATE 75 MG PO CAPS: 75.0000 mg | ORAL_CAPSULE | Freq: Two times a day (BID) | ORAL | Status: AC

## 2015-05-18 MED ORDER — AMOXICILLIN 500 MG PO CAPS: 1000.0000 mg | ORAL_CAPSULE | Freq: Two times a day (BID) | ORAL | Status: AC

## 2015-05-18 NOTE — Progress Notes (Signed)
Patient: Nancy Wood Female    DOB: 1970/06/18   45 y.o.   MRN: [id number] Visit Date: 05/18/2015  Today's Provider: Lelon Huh, MD   Chief Complaint  Patient presents with  . Cough   Subjective:    Cough This is a new problem. The current episode started in the past 7 days (4 day). The problem has been gradually worsening. The cough is non-productive. Associated symptoms include chills, a fever, headaches, myalgias, nasal congestion, postnasal drip, rhinorrhea, a sore throat, shortness of breath and sweats. Pertinent negatives include no chest pain, ear congestion, ear pain, heartburn, hemoptysis, rash, weight loss or wheezing. The symptoms are aggravated by lying down. Treatments tried: rubitussin, tylenol cold and flu, alka selzer. The treatment provided mild relief. There is no history of asthma, bronchiectasis, bronchitis, COPD, emphysema, environmental allergies or pneumonia.    Cough and fever for 4 days. Body aches, nasal congestion, cough, fever. Patient has been exposed to flu at work.    Allergies  Allergen Reactions  . Vicodin [Hydrocodone-Acetaminophen] Nausea And Vomiting    5/325    Previous Medications   ALPRAZOLAM (XANAX) 1 MG TABLET    TAKE 1/2 TO 1 TABLET BY MOUTH 3 TIMES DAILY AS NEEDED    Review of Systems  Constitutional: Positive for fever and chills. Negative for weight loss, appetite change and fatigue.  HENT: Positive for congestion, postnasal drip, rhinorrhea and sore throat. Negative for ear pain.   Respiratory: Positive for cough and shortness of breath. Negative for hemoptysis, chest tightness and wheezing.   Cardiovascular: Negative for chest pain and palpitations.  Gastrointestinal: Negative for heartburn, nausea, vomiting and abdominal pain.  Musculoskeletal: Positive for myalgias.  Skin: Negative for rash.  Allergic/Immunologic: Negative for environmental allergies.  Neurological: Positive for headaches. Negative for dizziness  and weakness.    Social History  Substance Use Topics  . Smoking status: Current Every Day Smoker -- 1.00 packs/day for 22 years    Types: Cigarettes  . Smokeless tobacco: Not on file     Comment: per patient will try electronic ciarettes.  . Alcohol Use: No   Objective:   BP 120/70 mmHg  Pulse 104  Temp(Src) 101.2 F (38.4 C) (Oral)  Resp 16  Ht 5' 5.5" (1.664 m)  Wt 189 lb (85.73 kg)  BMI 30.96 kg/m2  SpO2 97%  Physical Exam  General Appearance:    Alert, cooperative, no distress  HENT:   bilateral TM normal without fluid or infection, pharynx erythematous without exudate, frontal  sinus tender and nasal mucosa congested  Eyes:    PERRL, conjunctiva/corneas clear, EOM's intact       Lungs:     Clear to auscultation bilaterally, respirations unlabored  Heart:    Regular rate and rhythm  Neurologic:   Awake, alert, oriented x 3. No apparent focal neurological           defect.        Results for orders placed or performed in visit on 05/18/15  POCT Influenza A/B  Result Value Ref Range   Influenza A, POC Negative Negative   Influenza B, POC Positive (A) Negative       Assessment & Plan:     1. Fever, unspecified fever cause  - POCT Influenza A/B  2. Influenza B Start Tamiflu. She does have sinus tenderness and instructed to start amoxicillin if sinuses not feeling better in 2-3 days.  - amoxicillin (AMOXIL) 500 MG capsule; Take 2  capsules (1,000 mg total) by mouth 2 (two) times daily.  Dispense: 40 capsule; Refill: 0 - oseltamivir (TAMIFLU) 75 MG capsule; Take 1 capsule (75 mg total) by mouth 2 (two) times daily.  Dispense: 10 capsule; Refill: 0  Call if symptoms change or if not rapidly improving.           Lelon Huh, MD  [facility]

## 2015-11-25 ENCOUNTER — Other Ambulatory Visit: Payer: Self-pay | Admitting: Family Medicine

## 2015-11-25 NOTE — Telephone Encounter (Signed)
Please call in alprazolam.  

## 2015-11-26 NOTE — Telephone Encounter (Signed)
Rx called in to pharmacy. 

## 2015-12-08 ENCOUNTER — Encounter: Payer: Self-pay | Admitting: Family Medicine

## 2015-12-08 ENCOUNTER — Ambulatory Visit (INDEPENDENT_AMBULATORY_CARE_PROVIDER_SITE_OTHER): Payer: 59 | Admitting: Family Medicine

## 2015-12-08 VITALS — BP 110/84 | HR 84 | Temp 98.2°F | Resp 16 | Ht 66.0 in | Wt 194.0 lb

## 2015-12-08 DIAGNOSIS — Z23 Encounter for immunization: Secondary | ICD-10-CM | POA: Diagnosis not present

## 2015-12-08 DIAGNOSIS — E781 Pure hyperglyceridemia: Secondary | ICD-10-CM | POA: Diagnosis not present

## 2015-12-08 DIAGNOSIS — R7303 Prediabetes: Secondary | ICD-10-CM

## 2015-12-08 DIAGNOSIS — Z6831 Body mass index (BMI) 31.0-31.9, adult: Secondary | ICD-10-CM | POA: Diagnosis not present

## 2015-12-08 DIAGNOSIS — F17200 Nicotine dependence, unspecified, uncomplicated: Secondary | ICD-10-CM | POA: Diagnosis not present

## 2015-12-08 NOTE — Progress Notes (Signed)
Patient: Nancy Wood Female    DOB: 02/04/1971   45 y.o.   MRN: [id number] Visit Date: 12/08/2015  Today's Provider: Lelon Huh, MD   Chief Complaint  Patient presents with  . Hyperglycemia  . Anxiety  . Obesity   Subjective:    HPI  Prediabetes, Follow-up:   Lab Results  Component Value Date   HGBA1C 6.3 11/26/2014   HGBA1C 5.9 05/28/2014   HGBA1C 6.3 (H) 01/13/2014   GLUCOSE 153 (H) 01/14/2014   GLUCOSE 85 01/06/2014    Last seen for for this1 years ago.  Management since that visit includes A1c checked. Current symptoms include none and have been stable.  Weight trend: fluctuating a bit Prior visit with dietician: no Current diet: in general, a "healthy" diet   Current exercise: housecleaning  Pertinent Labs:    Component Value Date/Time   CHOL 217 (H) 12/01/2014 0804   TRIG 491 (H) 12/01/2014 0804   CHOLHDL 5.9 (H) 12/01/2014 0804   CREATININE 0.76 01/14/2014 0500    Wt Readings from Last 3 Encounters:  12/08/15 194 lb (88 kg)  05/18/15 189 lb (85.7 kg)  11/26/14 192 lb (87.1 kg)    Anxiety, Follow-up  She  was last seen for this 1 years ago. Changes made at last visit include no changes made.   She reports excellent compliance with treatment. She is not having side effects.   She reports excellent tolerance of treatment. Current symptoms include: none She feels she is Improved since last visit.  ------------------------------------------------------------------------   Follow up for BMI 31.0-31.9  The patient was last seen for this 1 years ago. Changes made at last visit include continue diet and exercise.  She reports fair compliance with treatment. She feels that condition is Unchanged. She is not having side effects.   ------------------------------------------------------------------------------------  Follow up hypertriglyceridemia Lab Results  Component Value Date   CHOL 217 (H) 12/01/2014   HDL 37 (L)  12/01/2014   LDLCALC Comment 12/01/2014   TRIG 491 (H) 12/01/2014   CHOLHDL 5.9 (H) 12/01/2014   Is avoid sweets and sugary foods. Stays active, but not on any formal exercise program.       Allergies  Allergen Reactions  . Vicodin [Hydrocodone-Acetaminophen] Nausea And Vomiting    5/325      Current Outpatient Prescriptions:  .  ALPRAZolam (XANAX) 1 MG tablet, TAKE 1/2-1 TABLET BY MOUTH 3 TIMES A DAY AS NEEDED, Disp: 90 tablet, Rfl: 5  Review of Systems  Constitutional: Negative.   Cardiovascular: Negative.   Endocrine: Negative.   Psychiatric/Behavioral: Negative.     Social History  Substance Use Topics  . Smoking status: Current Every Day Smoker    Packs/day: 1.00    Years: 22.00    Types: Cigarettes  . Smokeless tobacco: Not on file     Comment: per patient will try electronic ciarettes.  . Alcohol use No   Objective:   BP 110/84 (BP Location: Right Arm, Patient Position: Sitting, Cuff Size: Large)   Pulse 84   Temp 98.2 F (36.8 C) (Oral)   Resp 16   Ht 5\' 6"  (1.676 m)   Wt 194 lb (88 kg)   BMI 31.31 kg/m   Physical Exam   General Appearance:    Alert, cooperative, no distress  Eyes:    PERRL, conjunctiva/corneas clear, EOM's intact       Lungs:     Clear to auscultation bilaterally, respirations unlabored  Heart:  Regular rate and rhythm  Neurologic:   Awake, alert, oriented x 3. No apparent focal neurological           defect.           Assessment & Plan:     1. Hypertriglyceridemia  - Lipid panel - Comprehensive metabolic panel  2. Pre-diabetes  - Hemoglobin A1c - Comprehensive metabolic panel  3. Compulsive tobacco user syndrome Down to about 1/2 ppweek  4. Need for influenza vaccination Flu vaccine  5. BMI 31.0-31.9,adult Discussed reduce calories and increasing exercise to 150 minutes per week with goal of losing 4-5 pounds every month to keep weight below 180, with secondary goal of 165 for a BMI about 27.       Lelon Huh, MD  [facility]

## 2015-12-15 ENCOUNTER — Telehealth: Payer: Self-pay

## 2015-12-15 DIAGNOSIS — E119 Type 2 diabetes mellitus without complications: Secondary | ICD-10-CM

## 2015-12-15 DIAGNOSIS — E7849 Other hyperlipidemia: Secondary | ICD-10-CM

## 2015-12-15 LAB — COMPREHENSIVE METABOLIC PANEL
ALBUMIN: 4.8 g/dL (ref 3.5–5.5)
ALT: 50 IU/L — ABNORMAL HIGH (ref 0–32)
AST: 33 IU/L (ref 0–40)
Albumin/Globulin Ratio: 1.6 (ref 1.2–2.2)
Alkaline Phosphatase: 88 IU/L (ref 39–117)
BILIRUBIN TOTAL: 0.3 mg/dL (ref 0.0–1.2)
BUN/Creatinine Ratio: 20 (ref 9–23)
BUN: 16 mg/dL (ref 6–24)
CO2: 21 mmol/L (ref 18–29)
Calcium: 10.1 mg/dL (ref 8.7–10.2)
Chloride: 99 mmol/L (ref 96–106)
Creatinine, Ser: 0.8 mg/dL (ref 0.57–1.00)
GFR calc non Af Amer: 88 mL/min/{1.73_m2} (ref >59)
GFR, EST AFRICAN AMERICAN: 102 mL/min/{1.73_m2} (ref >59)
GLUCOSE: 253 mg/dL — AB (ref 65–99)
Globulin, Total: 3 g/dL (ref 1.5–4.5)
Potassium: 4.6 mmol/L (ref 3.5–5.2)
Sodium: 137 mmol/L (ref 134–144)
TOTAL PROTEIN: 7.8 g/dL (ref 6.0–8.5)

## 2015-12-15 LAB — LIPID PANEL
CHOLESTEROL TOTAL: 237 mg/dL — AB (ref 100–199)
Chol/HDL Ratio: 5.5 ratio units — ABNORMAL HIGH (ref 0.0–4.4)
HDL: 43 mg/dL (ref >39)
TRIGLYCERIDES: 550 mg/dL — AB (ref 0–149)

## 2015-12-15 LAB — HEMOGLOBIN A1C
Est. average glucose Bld gHb Est-mCnc: 203 mg/dL
Hgb A1c MFr Bld: 8.7 % — ABNORMAL HIGH (ref 4.8–5.6)

## 2015-12-15 MED ORDER — ATORVASTATIN CALCIUM 20 MG PO TABS: 20.0000 mg | ORAL_TABLET | Freq: Every day | ORAL | 3 refills | Status: DC

## 2015-12-15 MED ORDER — METFORMIN HCL ER 500 MG PO TB24: 500.0000 mg | ORAL_TABLET | Freq: Every day | ORAL | 3 refills | Status: DC

## 2015-12-15 NOTE — Telephone Encounter (Signed)
Advised patient as below. Medications were sent into the pharmacy.

## 2015-12-15 NOTE — Telephone Encounter (Signed)
Pt returned Michell's call.  Please call her at [phone]  Thanks Con [employer]os

## 2015-12-15 NOTE — Telephone Encounter (Signed)
Left message to call back  

## 2015-12-15 NOTE — Telephone Encounter (Signed)
-----   Message from Birdie Sons, MD sent at 12/15/2015  7:57 AM EDT ----- a1c is now 8.7 indicating average blood sugar of 203, which is diabetic range. Need referral to lifestyle center for diabetic education. Need to start metformin ER 500mg  daily, #30, rf x 3. Triglycerides and cholesterol are also very high, need to start atorvastatin 20mg  daily, #30, rf x 3. Need to schedule follow up o.v. In 6-8 weeks.

## 2015-12-16 NOTE — Telephone Encounter (Signed)
done

## 2016-07-28 ENCOUNTER — Other Ambulatory Visit: Payer: Self-pay | Admitting: Family Medicine

## 2016-07-29 NOTE — Telephone Encounter (Signed)
Please call in alprazolam.  

## 2016-07-29 NOTE — Telephone Encounter (Signed)
Rx called in to pharmacy. 

## 2017-03-07 ENCOUNTER — Other Ambulatory Visit: Payer: Self-pay | Admitting: Family Medicine

## 2017-03-08 NOTE — Telephone Encounter (Signed)
L/M stating below.  

## 2017-03-08 NOTE — Telephone Encounter (Signed)
Please advise patient have sent 30 day refill for alprazolam to her pharmacy, but she is do for follow up o.v. And needs to schedule before next refill

## 2017-12-06 ENCOUNTER — Other Ambulatory Visit (HOSPITAL_COMMUNITY): Payer: Self-pay | Admitting: Obstetrics & Gynecology

## 2017-12-06 DIAGNOSIS — Z1231 Encounter for screening mammogram for malignant neoplasm of breast: Secondary | ICD-10-CM

## 2018-01-03 ENCOUNTER — Ambulatory Visit
Admission: RE | Admit: 2018-01-03 | Discharge: 2018-01-03 | Disposition: A | Discharge status: Home / Self Care | Payer: Managed Care, Other (non HMO) | Source: Ambulatory Visit | Attending: Family Medicine | Admitting: Family Medicine

## 2018-01-03 ENCOUNTER — Encounter: Payer: Self-pay | Admitting: Family Medicine

## 2018-01-03 ENCOUNTER — Ambulatory Visit (INDEPENDENT_AMBULATORY_CARE_PROVIDER_SITE_OTHER): Payer: Managed Care, Other (non HMO) | Admitting: Family Medicine

## 2018-01-03 VITALS — BP 120/88 | HR 98 | Temp 98.9°F | Resp 16 | Ht 66.0 in | Wt 171.0 lb

## 2018-01-03 DIAGNOSIS — R110 Nausea: Secondary | ICD-10-CM

## 2018-01-03 DIAGNOSIS — Z96642 Presence of left artificial hip joint: Secondary | ICD-10-CM | POA: Diagnosis not present

## 2018-01-03 DIAGNOSIS — E781 Pure hyperglyceridemia: Secondary | ICD-10-CM

## 2018-01-03 DIAGNOSIS — M533 Sacrococcygeal disorders, not elsewhere classified: Secondary | ICD-10-CM

## 2018-01-03 DIAGNOSIS — M1611 Unilateral primary osteoarthritis, right hip: Secondary | ICD-10-CM | POA: Insufficient documentation

## 2018-01-03 DIAGNOSIS — M25552 Pain in left hip: Secondary | ICD-10-CM

## 2018-01-03 DIAGNOSIS — F17200 Nicotine dependence, unspecified, uncomplicated: Secondary | ICD-10-CM

## 2018-01-03 DIAGNOSIS — R7303 Prediabetes: Secondary | ICD-10-CM | POA: Diagnosis not present

## 2018-01-03 DIAGNOSIS — Z0000 Encounter for general adult medical examination without abnormal findings: Secondary | ICD-10-CM | POA: Diagnosis not present

## 2018-01-03 DIAGNOSIS — Z23 Encounter for immunization: Secondary | ICD-10-CM | POA: Diagnosis not present
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
## 2018-01-04 ENCOUNTER — Telehealth: Payer: Self-pay

## 2018-01-04 ENCOUNTER — Other Ambulatory Visit: Payer: Self-pay

## 2018-01-04 DIAGNOSIS — E781 Pure hyperglyceridemia: Secondary | ICD-10-CM

## 2018-01-04 DIAGNOSIS — E119 Type 2 diabetes mellitus without complications: Secondary | ICD-10-CM

## 2018-01-04 LAB — COMPREHENSIVE METABOLIC PANEL
A/G RATIO: 1.6 (ref 1.2–2.2)
ALBUMIN: 4.7 g/dL (ref 3.5–5.5)
ALT: 25 IU/L (ref 0–32)
AST: 20 IU/L (ref 0–40)
Alkaline Phosphatase: 108 IU/L (ref 39–117)
BUN/Creatinine Ratio: 17 (ref 9–23)
BUN: 13 mg/dL (ref 6–24)
Bilirubin Total: 0.3 mg/dL (ref 0.0–1.2)
CALCIUM: 10.2 mg/dL (ref 8.7–10.2)
CO2: 21 mmol/L (ref 20–29)
Chloride: 97 mmol/L (ref 96–106)
Creatinine, Ser: 0.78 mg/dL (ref 0.57–1.00)
GFR, EST AFRICAN AMERICAN: 103 mL/min/{1.73_m2} (ref >59)
GFR, EST NON AFRICAN AMERICAN: 90 mL/min/{1.73_m2} (ref >59)
GLOBULIN, TOTAL: 3 g/dL (ref 1.5–4.5)
Glucose: 285 mg/dL — ABNORMAL HIGH (ref 65–99)
Potassium: 4.1 mmol/L (ref 3.5–5.2)
SODIUM: 135 mmol/L (ref 134–144)
TOTAL PROTEIN: 7.7 g/dL (ref 6.0–8.5)

## 2018-01-04 LAB — HEMOGLOBIN A1C
Est. average glucose Bld gHb Est-mCnc: 298 mg/dL
Hgb A1c MFr Bld: 12 % — ABNORMAL HIGH (ref 4.8–5.6)

## 2018-01-04 LAB — LIPID PANEL
CHOL/HDL RATIO: 6.1 ratio — AB (ref 0.0–4.4)
Cholesterol, Total: 255 mg/dL — ABNORMAL HIGH (ref 100–199)
HDL: 42 mg/dL (ref >39)
Triglycerides: 666 mg/dL (ref 0–149)

## 2018-01-04 LAB — AMYLASE: AMYLASE: 62 U/L (ref 31–124)

## 2018-01-04 MED ORDER — PRAVASTATIN SODIUM 40 MG PO TABS: 40.0000 mg | ORAL_TABLET | Freq: Every day | ORAL | 3 refills | Status: DC

## 2018-01-04 MED ORDER — METFORMIN HCL ER 500 MG PO TB24: 500.0000 mg | ORAL_TABLET | Freq: Two times a day (BID) | ORAL | 3 refills | Status: DC

## 2018-01-04 NOTE — Telephone Encounter (Signed)
-----   Message from Birdie Sons, MD sent at 01/04/2018  8:02 AM EST ----- Diabetes is out of control. a1c is 12.0 indicating average blood sugar of 298. Need to start back on metformin ER 500mg  twice a day, #60, rf x 3.  Need referral to lifestyle center for uncontrolled diabetes.  Cholesterol is very high at 255. Need to start pravastatin 40mg  once a day, #30, rf x 3.  Very high risk of developing heart disease. Need to stop smoking if she hasn't already Need to check fasting sugar daily and schedule follow up in 1 month.

## 2018-01-04 NOTE — Telephone Encounter (Signed)
Appointment schedule for 02/08/2018 at 8am.

## 2018-01-04 NOTE — Telephone Encounter (Signed)
Patient was advised and medication filled.

## 2018-01-04 NOTE — Telephone Encounter (Signed)
Pt returned missed call.  Please call pt's desk number [phone].  Thanks, [employer]

## 2018-01-04 NOTE — Telephone Encounter (Signed)
LVM[facility] 

## 2018-01-15 ENCOUNTER — Ambulatory Visit
Admission: RE | Admit: 2018-01-15 | Discharge: 2018-01-15 | Disposition: A | Discharge status: Home / Self Care | Payer: Managed Care, Other (non HMO) | Source: Ambulatory Visit | Attending: Obstetrics & Gynecology | Admitting: Obstetrics & Gynecology

## 2018-01-15 DIAGNOSIS — Z1231 Encounter for screening mammogram for malignant neoplasm of breast: Secondary | ICD-10-CM

## 2018-02-01 ENCOUNTER — Ambulatory Visit (INDEPENDENT_AMBULATORY_CARE_PROVIDER_SITE_OTHER): Payer: Managed Care, Other (non HMO) | Admitting: Obstetrics & Gynecology

## 2018-02-01 ENCOUNTER — Encounter: Payer: Self-pay | Admitting: Obstetrics & Gynecology

## 2018-02-01 VITALS — BP 114/82 | Wt 171.0 lb

## 2018-02-01 DIAGNOSIS — Z01419 Encounter for gynecological examination (general) (routine) without abnormal findings: Secondary | ICD-10-CM | POA: Diagnosis not present

## 2018-02-01 MED ORDER — ESTRADIOL 10 MCG VA TABS: ORAL_TABLET | VAGINAL | 12 refills | Status: DC

## 2018-02-01 NOTE — Progress Notes (Signed)
Mammogram 2019- normal  Last pap 2013

## 2018-02-01 NOTE — Progress Notes (Signed)
LAST PAP 01/30/2012

## 2018-02-01 NOTE — Progress Notes (Signed)
Subjective:    Nancy Wood is a 47 y.o. married P3 (28, 23, and 74 yo kids, and 1 granddaughter)  female who presents for an annual exam. She has vulvar itching, sometimes has to use a lubricant with sex. The patient is sexually active. GYN screening history: last pap: was normal. The patient wears seatbelts: yes. The patient participates in regular exercise: no. Has the patient ever been transfused or tattooed?: yes. The patient reports that there is not domestic violence in her life.   Menstrual History: OB History    Gravida  4   Para      Term      Preterm      AB  1   Living  3     SAB  1   TAB      Ectopic      Multiple      Live Births  60           Menarche age: 5 Patient's last menstrual period was 06/01/2014.    The following portions of the patient's history were reviewed and updated as appropriate: allergies, current medications, past family history, past medical history, past social history, past surgical history and problem list.  Review of Systems Pertinent items are noted in HPI.   Married for 22 years Works at [employer] No period for 3 years, no more hot flashes FH- + breast in her mom and sister, no gyn or colon cancer She is negative for BRCA.   Objective:    BP 114/82   Wt 171 lb (77.6 kg)   LMP 06/01/2014   BMI 27.60 kg/m   General Appearance:    Alert, cooperative, no distress, appears stated age  Head:    Normocephalic, without obvious abnormality, atraumatic  Eyes:    PERRL, conjunctiva/corneas clear, EOM's intact, fundi    benign, both eyes  Ears:    Normal TM's and external ear canals, both ears  Nose:   Nares normal, septum midline, mucosa normal, no drainage    or sinus tenderness  Throat:   Lips, mucosa, and tongue normal; teeth and gums normal  Neck:   Supple, symmetrical, trachea midline, no adenopathy;    thyroid:  no enlargement/tenderness/nodules; no carotid   bruit or JVD  Back:     Symmetric, no curvature, ROM  normal, no CVA tenderness  Lungs:     Clear to auscultation bilaterally, respirations unlabored  Chest Wall:    No tenderness or deformity   Heart:    Regular rate and rhythm, S1 and S2 normal, no murmur, rub   or gallop  Breast Exam:    No tenderness, masses, or nipple abnormality  Abdomen:     Soft, non-tender, bowel sounds active all four quadrants,    no masses, no organomegaly  Genitalia:    Normal female without lesion, discharge or tenderness, marked atrophy, 1.5 cm linear tear on her right bottom labia majora, normal size and shape, anteverted, mobile, non-tender, adnexa not palpable      Extremities:   Extremities normal, atraumatic, no cyanosis or edema  Pulses:   2+ and symmetric all extremities  Skin:   Skin color, texture, turgor normal, no rashes or lesions  Lymph nodes:   Cervical, supraclavicular, and axillary nodes normal  Neurologic:   CNII-XII intact, normal strength, sensation and reflexes    throughout  .    Assessment:    Healthy female exam.   Vulvar itching, atrophy   Plan:  Thin prep Pap smear. with cotesting Treat with vagifem 3 times per week If this is too expensive, rec [place] with coupon

## 2018-02-01 NOTE — Addendum Note (Signed)
Addended by: Phillip Heal, Mcgregor Tinnon A on: 02/01/2018 02:57 PM   Modules accepted: Orders

## 2018-02-01 NOTE — Addendum Note (Signed)
Addended by: Phillip Heal, DEMETRICE A on: 02/01/2018 02:36 PM   Modules accepted: Orders

## 2018-02-05 LAB — PAP IG AND HPV HIGH-RISK
HPV, HIGH-RISK: NEGATIVE
PAP Smear Comment: 0

## 2018-02-08 ENCOUNTER — Encounter: Payer: Self-pay | Admitting: Family Medicine

## 2018-02-08 ENCOUNTER — Ambulatory Visit: Payer: Managed Care, Other (non HMO) | Admitting: Family Medicine

## 2018-02-08 VITALS — BP 110/78 | HR 88 | Temp 98.2°F | Resp 16 | Wt 170.0 lb

## 2018-02-08 DIAGNOSIS — F17200 Nicotine dependence, unspecified, uncomplicated: Secondary | ICD-10-CM | POA: Diagnosis not present

## 2018-02-08 DIAGNOSIS — E118 Type 2 diabetes mellitus with unspecified complications: Secondary | ICD-10-CM

## 2018-02-08 DIAGNOSIS — E782 Mixed hyperlipidemia: Secondary | ICD-10-CM

## 2018-02-08 MED ORDER — BUPROPION HCL ER (SR) 150 MG PO TB12: ORAL_TABLET | ORAL | 11 refills | Status: DC

## 2018-02-08 MED ORDER — GLUCOSE BLOOD VI STRP: ORAL_STRIP | 8 refills | Status: DC

## 2018-02-08 NOTE — Progress Notes (Signed)
Patient: Nancy Wood Female    DOB: Dec 18, 1970   47 y.o.   MRN: [id number] Visit Date: 02/08/2018  Today's Provider: Lelon Huh, MD   Chief Complaint  Patient presents with  . Hyperlipidemia  . Diabetes   Subjective:     HPI     Diabetes Mellitus Type II, Follow-up:   Lab Results  Component Value Date   HGBA1C 12.0 (H) 01/03/2018   HGBA1C 8.7 (H) 12/14/2015   HGBA1C 6.3 11/26/2014   Last seen for diabetes 1 months ago.  Management since then includes started metformin 500mg  1 twice a day. She reports excellent compliance with treatment. She is not having side effects.  Current symptoms include none and have been improving. Home blood sugar records: Pt is not checking blood sugar at home.  She needs a prescription for a meter.  Episodes of hypoglycemia? no   Current Insulin Regimen: None Most Recent Eye Exam: Pt states she will have an eye exam at the beginning of 2020.  Weight trend: stable Prior visit with dietician: no  Pt states she can't afford this. Current diet: in general, a "healthy" diet   Current exercise: walking  ------------------------------------------------------------------------     Lipid/Cholesterol, Follow-up:   Last seen for this 1 months ago.  Management since that visit includes Added pravastatin 40mg  daily  Last Lipid Panel:    Component Value Date/Time   CHOL 255 (H) 01/03/2018 0955   TRIG 666 (HH) 01/03/2018 0955   HDL 42 01/03/2018 0955   CHOLHDL 6.1 (H) 01/03/2018 0955   LDLCALC Comment 01/03/2018 0955    She reports excellent compliance with treatment. She is not having side effects.   Wt Readings from Last 3 Encounters:  02/08/18 170 lb (77.1 kg)  02/01/18 171 lb (77.6 kg)  01/03/18 171 lb (77.6 kg)    ------------------------------------------------------------------------  Smoking cessation She continues to smoke about a pack a day. Has not tried quitting lately, but is considering making an  attempt.   Allergies  Allergen Reactions  . Vicodin [Hydrocodone-Acetaminophen] Nausea And Vomiting    5/325      Current Outpatient Medications:  .  Estradiol 10 MCG TABS vaginal tablet, 1 tablet per vagina 3 times per week, Disp: 30 tablet, Rfl: 12 .  metFORMIN (GLUCOPHAGE-XR) 500 MG 24 hr tablet, Take 1 tablet (500 mg total) by mouth 2 (two) times daily., Disp: 60 tablet, Rfl: 3 .  pravastatin (PRAVACHOL) 40 MG tablet, Take 1 tablet (40 mg total) by mouth daily., Disp: 30 tablet, Rfl: 3 .  buPROPion (WELLBUTRIN SR) 150 MG 12 hr tablet, 1 tablet daily for 3 days, then 1 tablet twice daily. Stop smoking 14 days after starting medication, Disp: 60 tablet, Rfl: 11   Review of Systems  Constitutional: Negative.   Respiratory: Negative.   Cardiovascular: Negative.   Gastrointestinal: Positive for nausea. Negative for abdominal distention, abdominal pain, anal bleeding, blood in stool, constipation, diarrhea, rectal pain and vomiting.  Endocrine: Negative.   Musculoskeletal: Negative.   Neurological: Negative for dizziness, light-headedness and headaches.    Social History   Tobacco Use  . Smoking status: Current Every Day Smoker    Packs/day: 1.00    Years: 22.00    Pack years: 22.00    Types: Cigarettes  . Smokeless tobacco: Never Used  . Tobacco comment: per patient will try electronic ciarettes.  Substance Use Topics  . Alcohol use: No      Objective:   BP 110/78 (  BP Location: Right Arm, Patient Position: Sitting, Cuff Size: Normal)   Pulse 88   Temp 98.2 F (36.8 C) (Oral)   Resp 16   Wt 170 lb (77.1 kg)   LMP 06/01/2014   BMI 27.44 kg/m  Vitals:   02/08/18 0819  BP: 110/78  Pulse: 88  Resp: 16  Temp: 98.2 F (36.8 C)  TempSrc: Oral  Weight: 170 lb (77.1 kg)     Physical Exam  General appearance: alert, well developed, well nourished, cooperative and in no distress Head: Normocephalic, without obvious abnormality, atraumatic Respiratory:  Respirations even and unlabored, normal respiratory rate Extremities: No gross deformities Skin: Skin color, texture, turgor normal. No rashes seen  Psych: Appropriate mood and affect. Neurologic: Mental status: Alert, oriented to person, place, and time, thought content appropriate.     Assessment & Plan    1. Controlled type 2 diabetes mellitus with complication, without long-term current use of insulin ([place]) Tolerating initiation of metformin well. Counseled regarding prudent diet and regular exercise.  Given meter to periodically monitor her fasting sugars. Recommend starting OTC vitamin B12 supplements. She declined referral to diabetic education due to cost, but is very motivated to educate herself and is already aware of diet changes she needs to make.  - Comprehensive metabolic panel  2. Mixed hyperlipidemia She is tolerating pravastatin well with no adverse effects.   - Lipid panel  3. Compulsive tobacco user syndrome Counseled health benefits of smoking cessation. She will consider trial of bupropion. Given printed prescription.   Return in about 3 months (around 05/10/2018).      Lelon Huh, MD  [facility]

## 2018-02-08 NOTE — Patient Instructions (Addendum)
. Please contact your eyecare professional to schedule a routine eye exam    Start taking OTC vitamin B12 1,000 units once a day   Diabetes Basics  Diabetes (diabetes mellitus) is a long-term (chronic) disease. It occurs when the body does not properly use sugar (glucose) that is released from food after you eat. Diabetes may be caused by one or both of these problems:  Your pancreas does not make enough of a hormone called insulin.  Your body does not react in a normal way to insulin that it makes. Insulin lets sugars (glucose) go into cells in your body. This gives you energy. If you have diabetes, sugars cannot get into cells. This causes high blood sugar (hyperglycemia). Follow these instructions at home: How is diabetes treated? You may need to take insulin or other diabetes medicines daily to keep your blood sugar in balance. Take your diabetes medicines every day as told by your doctor. List your diabetes medicines here: Diabetes medicines  Name of medicine: ______________________________ ? Amount (dose): _______________ Time (a.m./p.m.): _______________ Notes: ___________________________________  Name of medicine: ______________________________ ? Amount (dose): _______________ Time (a.m./p.m.): _______________ Notes: ___________________________________  Name of medicine: ______________________________ ? Amount (dose): _______________ Time (a.m./p.m.): _______________ Notes: ___________________________________  How do I manage my blood sugar?  Check your blood sugar levels using a blood glucose monitor as directed by your doctor. Your doctor will set treatment goals for you. Generally, you should have these blood sugar levels:  Before meals (preprandial): 80-130 mg/dL (4.4-7.2 mmol/L).  After meals (postprandial): below 180 mg/dL (10 mmol/L).  A1c level: less than 7%. Write down the times that you will check your blood sugar levels: Blood sugar checks  Time:  _______________ Notes: ___________________________________  Time: _______________ Notes: ___________________________________  Time: _______________ Notes: ___________________________________  Time: _______________ Notes: ___________________________________  Time: _______________ Notes: ___________________________________  Time: _______________ Notes: ___________________________________  What do I need to know about low blood sugar? Low blood sugar is called hypoglycemia. This is when blood sugar is at or below 70 mg/dL (3.9 mmol/L). Symptoms may include:  Feeling: ? Hungry. ? Worried or nervous (anxious). ? Sweaty and clammy. ? Confused. ? Dizzy. ? Sleepy. ? Sick to your stomach (nauseous).  Having: ? A fast heartbeat. ? A headache. ? A change in your vision. ? Tingling or no feeling (numbness) around the mouth, lips, or tongue. ? Jerky movements that you cannot control (seizure).  Having trouble with: ? Moving (coordination). ? Sleeping. ? Passing out (fainting). ? Getting upset easily (irritability).   Questions to ask your health care provider  Do I need to meet with a diabetes educator?  What equipment will I need to care for myself at home?  What diabetes medicines do I need? When should I take them?  How often do I need to check my blood sugar?  What number can I call if I have questions?  When is my next doctor's visit?  Where can I find a support group for people with diabetes? Where to find more information  American Diabetes Association: www.diabetes.org  American Association of Diabetes Educators: www.diabeteseducator.org/patient-resources Contact a doctor if:  Your blood sugar is at or above 240 mg/dL (13.3 mmol/L) for 2 days in a row.  You have been sick or have had a fever for 2 days or more, and you are not getting better.  You have any of these problems for more than 6 hours: ? You cannot eat or drink. ? You feel sick to your stomach  (  nauseous). ? You throw up (vomit). ? You have watery poop (diarrhea). Get help right away if:  Your blood sugar is lower than 54 mg/dL (3 mmol/L).  You get confused.  You have trouble: ? Thinking clearly. ? Breathing. Summary  Diabetes (diabetes mellitus) is a long-term (chronic) disease. It occurs when the body does not properly use sugar (glucose) that is released from food after digestion.  Take insulin and diabetes medicines as told.  Check your blood sugar every day, as often as told.  Keep all follow-up visits as told by your doctor. This is important. This information is not intended to replace advice given to you by your health care provider. Make sure you discuss any questions you have with your health care provider. Document Released: 05/12/2017 Document Revised: 07/31/2017 Document Reviewed: 05/12/2017 Elsevier Interactive Patient Education  2019 [employer].

## 2018-02-09 LAB — LIPID PANEL
Chol/HDL Ratio: 3.1 ratio (ref 0.0–4.4)
Cholesterol, Total: 163 mg/dL (ref 100–199)
HDL: 52 mg/dL (ref >39)
LDL Calculated: 58 mg/dL (ref 0–99)
Triglycerides: 265 mg/dL — ABNORMAL HIGH (ref 0–149)
VLDL Cholesterol Cal: 53 mg/dL — ABNORMAL HIGH (ref 5–40)

## 2018-02-09 LAB — COMPREHENSIVE METABOLIC PANEL
ALT: 21 IU/L (ref 0–32)
AST: 12 IU/L (ref 0–40)
Albumin/Globulin Ratio: 1.8 (ref 1.2–2.2)
Albumin: 4.9 g/dL (ref 3.5–5.5)
Alkaline Phosphatase: 89 IU/L (ref 39–117)
BILIRUBIN TOTAL: 0.3 mg/dL (ref 0.0–1.2)
BUN/Creatinine Ratio: 18 (ref 9–23)
BUN: 13 mg/dL (ref 6–24)
CHLORIDE: 100 mmol/L (ref 96–106)
CO2: 22 mmol/L (ref 20–29)
Calcium: 10.1 mg/dL (ref 8.7–10.2)
Creatinine, Ser: 0.72 mg/dL (ref 0.57–1.00)
GFR calc Af Amer: 114 mL/min/{1.73_m2} (ref >59)
GFR calc non Af Amer: 99 mL/min/{1.73_m2} (ref >59)
GLOBULIN, TOTAL: 2.7 g/dL (ref 1.5–4.5)
Glucose: 189 mg/dL — ABNORMAL HIGH (ref 65–99)
Potassium: 4.1 mmol/L (ref 3.5–5.2)
Sodium: 139 mmol/L (ref 134–144)
Total Protein: 7.6 g/dL (ref 6.0–8.5)

## 2018-02-19 ENCOUNTER — Telehealth: Payer: Self-pay | Admitting: Family Medicine

## 2018-02-19 MED ORDER — LANCETS MISC: 4 refills | Status: DC

## 2018-02-19 NOTE — Telephone Encounter (Signed)
Pt requesting RX for lancets be sent into [facility] on [address] in [place].  States she was given an RX for the test strips but not the lancets.

## 2018-02-27 ENCOUNTER — Other Ambulatory Visit: Payer: Self-pay | Admitting: Family Medicine

## 2018-02-27 DIAGNOSIS — E781 Pure hyperglyceridemia: Secondary | ICD-10-CM

## 2018-02-27 DIAGNOSIS — E119 Type 2 diabetes mellitus without complications: Secondary | ICD-10-CM

## 2018-02-27 NOTE — Telephone Encounter (Deleted)
j

## 2018-02-27 NOTE — Telephone Encounter (Addendum)
[facility] faxed refill request for the following medications:  metFORMIN (GLUCOPHAGE-XR) 500 MG 24 hr tablet   pravastatin (PRAVACHOL) 40 MG tablet  Insurance requires 90 day supply.   Please advise.

## 2018-02-28 MED ORDER — PRAVASTATIN SODIUM 40 MG PO TABS: 40.0000 mg | ORAL_TABLET | Freq: Every day | ORAL | 5 refills | Status: DC

## 2018-02-28 MED ORDER — METFORMIN HCL ER 500 MG PO TB24: 500.0000 mg | ORAL_TABLET | Freq: Two times a day (BID) | ORAL | 5 refills | Status: DC

## 2018-03-06 ENCOUNTER — Other Ambulatory Visit: Payer: Self-pay | Admitting: Family Medicine

## 2018-03-06 ENCOUNTER — Telehealth: Payer: Self-pay | Admitting: Family Medicine

## 2018-03-06 DIAGNOSIS — E781 Pure hyperglyceridemia: Secondary | ICD-10-CM

## 2018-03-06 DIAGNOSIS — E119 Type 2 diabetes mellitus without complications: Secondary | ICD-10-CM

## 2018-03-06 MED ORDER — METFORMIN HCL ER 500 MG PO TB24: 500.0000 mg | ORAL_TABLET | Freq: Two times a day (BID) | ORAL | 11 refills | Status: DC

## 2018-03-06 MED ORDER — HYDROCODONE-ACETAMINOPHEN 5-325 MG PO TABS: 1.0000 | ORAL_TABLET | Freq: Four times a day (QID) | ORAL | 0 refills | Status: AC | PRN

## 2018-03-06 MED ORDER — PRAVASTATIN SODIUM 40 MG PO TABS: 40.0000 mg | ORAL_TABLET | Freq: Every day | ORAL | 11 refills | Status: DC

## 2018-03-06 NOTE — Telephone Encounter (Signed)
Dentist is not set up for escribe something for pain for her dry socket from the tooth that was recently pulled.  Pt asking if Dr. Caryn Section needs her to come in or if he can prescribe something for her dry socket pain.  Thanks, [employer]

## 2018-03-06 NOTE — Telephone Encounter (Signed)
Needing 3 month supply refills due to insurance. Pt has only 1 to 2 days left. metFORMIN (GLUCOPHAGE-XR) 500 MG 24 hr tablet pravastatin (PRAVACHOL) 40 MG tablet   Please fill at:  [facility], [place] AT [address] [phone] (Phone) [phone] (Fax)   Thanks, [employer]

## 2018-03-06 NOTE — Telephone Encounter (Signed)
Pt calling back 2nd time to check on the Rx request for dry socket.  Thanks, [employer]

## 2018-03-06 NOTE — Telephone Encounter (Signed)
Dr. Caryn Section, is this something that you can address, or should she follow up with her Dentist for this problem? Patient is requesting pain medication for dry socket pain from recent tooth extraction. Please advise.

## 2018-03-06 NOTE — Telephone Encounter (Signed)
She needs to follow up with her dentist about this. Is he out of town or something?  I could send in three days of medication to get by until her dentist can prescribed something for her.

## 2018-03-06 NOTE — Telephone Encounter (Signed)
Pt stated that the Dentist she used is not set up to send prescriptions electronically yet.  She saw them today and said she had dry socket the dentist was able to clean it out but could not legally prescribe something for the pain.  Pt is requesting to send a few day supply of pain medicine to [facility] on [address]      Please advise.   Thanks,   -Mickel Baas

## 2018-04-03 LAB — HM DIABETES EYE EXAM

## 2018-05-10 ENCOUNTER — Ambulatory Visit: Payer: Managed Care, Other (non HMO) | Admitting: Family Medicine

## 2018-06-11 ENCOUNTER — Ambulatory Visit: Payer: Managed Care, Other (non HMO) | Admitting: Family Medicine

## 2018-07-24 ENCOUNTER — Ambulatory Visit: Payer: Managed Care, Other (non HMO) | Admitting: Family Medicine

## 2018-07-24 ENCOUNTER — Other Ambulatory Visit: Payer: Self-pay

## 2018-07-24 ENCOUNTER — Encounter: Payer: Self-pay | Admitting: Family Medicine

## 2018-07-24 VITALS — BP 121/81 | HR 83 | Temp 98.4°F | Wt 177.0 lb

## 2018-07-24 DIAGNOSIS — F17200 Nicotine dependence, unspecified, uncomplicated: Secondary | ICD-10-CM | POA: Diagnosis not present

## 2018-07-24 DIAGNOSIS — E119 Type 2 diabetes mellitus without complications: Secondary | ICD-10-CM | POA: Diagnosis not present

## 2018-07-24 NOTE — Progress Notes (Signed)
Patient: Nancy Wood Female    DOB: 1970-12-05   48 y.o.   MRN: [id number] Visit Date: 07/24/2018  Today's Provider: Lelon Huh, MD   Chief Complaint  Patient presents with   Diabetes   Subjective:     HPI     Diabetes Mellitus Type II, Follow-up:   Lab Results  Component Value Date   HGBA1C 12.0 (H) 01/03/2018   HGBA1C 8.7 (H) 12/14/2015   HGBA1C 6.3 11/26/2014   Last seen for diabetes 6 months ago.  Management since then includes starting metformin. She reports good compliance with treatment. She is not having side effects.  Current symptoms include none and have been stable. Home blood sugar records: Pt only checks her blood sugar occasionally.  She states it is generally high.  Episodes of hypoglycemia? no   Most Recent Eye Exam: 03/2018 Weight trend: stable  Current diet: in general, a "healthy" diet   Current exercise: walking    Tobacco abuse  Was prescribed bupropion at previous visit, but states she has not yet started it. She would like to quit smoking, but was just furloughed from work.    Allergies  Allergen Reactions   Vicodin [Hydrocodone-Acetaminophen] Nausea And Vomiting    5/325      Current Outpatient Medications:    metFORMIN (GLUCOPHAGE-XR) 500 MG 24 hr tablet, Take 1 tablet (500 mg total) by mouth 2 (two) times daily., Disp: 60 tablet, Rfl: 11   pravastatin (PRAVACHOL) 40 MG tablet, Take 1 tablet (40 mg total) by mouth daily., Disp: 30 tablet, Rfl: 11   buPROPion (WELLBUTRIN SR) 150 MG 12 hr tablet, 1 tablet daily for 3 days, then 1 tablet twice daily. Stop smoking 14 days after starting medication, Disp: 60 tablet, Rfl: 11   Estradiol 10 MCG TABS vaginal tablet, 1 tablet per vagina 3 times per week (Patient not taking: Reported on 07/24/2018), Disp: 30 tablet, Rfl: 12   glucose blood (CONTOUR NEXT TEST) test strip, Use as instructed to check fasting sugar daily. Dx Type 2 diabetes E11.9 (Patient not taking:  Reported on 07/24/2018), Disp: 50 each, Rfl: 8   Lancets MISC, Use 1 lancet daily to check fingerstick blood sugar (Patient not taking: Reported on 07/24/2018), Disp: 100 each, Rfl: 4  Review of Systems  Constitutional: Negative.   Respiratory: Negative.   Cardiovascular: Negative.   Gastrointestinal: Negative.   Endocrine: Negative.   Neurological: Positive for headaches. Negative for dizziness and light-headedness.    Social History   Tobacco Use   Smoking status: Current Every Day Smoker    Packs/day: 1.00    Years: 22.00    Pack years: 22.00    Types: Cigarettes   Smokeless tobacco: Never Used   Tobacco comment: per patient will try electronic ciarettes.  Substance Use Topics   Alcohol use: No      Objective:   BP 121/81 (BP Location: Right Arm, Patient Position: Sitting, Cuff Size: Normal)    Pulse 83    Temp 98.4 F (36.9 C) (Oral)    Wt 177 lb (80.3 kg)    LMP 06/01/2014    BMI 28.57 kg/m  Vitals:   07/24/18 0827  BP: 121/81  Pulse: 83  Temp: 98.4 F (36.9 C)  TempSrc: Oral  Weight: 177 lb (80.3 kg)     Physical Exam  General appearance: alert, well developed, well nourished, cooperative and in no distress Head: Normocephalic, without obvious abnormality, atraumatic Respiratory: Respirations even and unlabored, normal  respiratory rate Extremities: No gross deformities Skin: Skin color, texture, turgor normal. No rashes seen  Psych: Appropriate mood and affect. Neurologic: Mental status: Alert, oriented to person, place, and time, thought content appropriate.     Assessment & Plan    1. Type 2 diabetes mellitus without complication, without long-term current use of insulin ([facility])  - Hemoglobin A1c  2. Compulsive tobacco user syndrome Encouraged to quite smoking. Still has prescription for bupropion which she may fill.      Lelon Huh, MD  [facility]

## 2018-07-24 NOTE — Patient Instructions (Addendum)
.   Please review the attached list of medications and notify my office if there are any errors.   . Please bring all of your medications to every appointment so we can make sure that our medication list is the same as yours.    Please stop smoking

## 2018-07-25 LAB — HEMOGLOBIN A1C
Est. average glucose Bld gHb Est-mCnc: 186 mg/dL
Hgb A1c MFr Bld: 8.1 % — ABNORMAL HIGH (ref 4.8–5.6)

## 2018-07-31 ENCOUNTER — Other Ambulatory Visit: Payer: Self-pay | Admitting: Obstetrics & Gynecology

## 2018-07-31 ENCOUNTER — Encounter: Payer: Self-pay | Admitting: Obstetrics & Gynecology

## 2018-07-31 ENCOUNTER — Other Ambulatory Visit: Payer: Self-pay

## 2018-07-31 ENCOUNTER — Ambulatory Visit (INDEPENDENT_AMBULATORY_CARE_PROVIDER_SITE_OTHER): Payer: Managed Care, Other (non HMO) | Admitting: Obstetrics & Gynecology

## 2018-07-31 VITALS — BP 120/86 | HR 90 | Ht 64.0 in | Wt 178.0 lb

## 2018-07-31 DIAGNOSIS — N644 Mastodynia: Secondary | ICD-10-CM | POA: Diagnosis not present

## 2018-07-31 NOTE — Addendum Note (Signed)
Addended by: Crosby Oyster on: 07/31/2018 09:37 AM   Modules accepted: Orders

## 2018-07-31 NOTE — Progress Notes (Signed)
   Subjective:    Patient ID: Nancy Wood, female    DOB: 10/24/1970, 48 y.o.   MRN: [id number]  HPI 48 yo married P3 here today with the issue of a breast issue. She recently has had sharp shooting pains in her left breast, outer upper quadrant. This started several months ago. Intermittent. Nothing makes it better but she can elicit the pain with pushing on it. She reports that her mom died at 62 with breast cancer and her sister had it in her mid 77's (alive and well). Patient breastfed 2 of her kids. She denies any nipple discharge or skin changes. Her last mammogram was 11/19. She had negative BRCA testing about 5+ years ago.   Review of Systems     Objective:   Physical Exam Breathing, conversing, and ambulating normally Well nourished, well hydrated White female, no apparent distress Breast exam bilaterally normal, no lymphadenopathy, skin changes or nipple discharge   Assessment & Plan:  Left breast pain with strong FH of breast cancer- NED on exam Left breast MRI

## 2018-07-31 NOTE — Progress Notes (Signed)
Has some sharp pains in left breast, and found a lump. Had a mammogram in dec 2019

## 2018-08-27 ENCOUNTER — Other Ambulatory Visit: Payer: Self-pay | Admitting: Obstetrics & Gynecology

## 2018-08-27 DIAGNOSIS — N644 Mastodynia: Secondary | ICD-10-CM

## 2018-08-28 ENCOUNTER — Other Ambulatory Visit: Payer: Managed Care, Other (non HMO)

## 2018-09-10 ENCOUNTER — Ambulatory Visit
Admission: RE | Admit: 2018-09-10 | Discharge: 2018-09-10 | Disposition: A | Discharge status: Home / Self Care | Payer: Managed Care, Other (non HMO) | Source: Ambulatory Visit | Attending: Obstetrics & Gynecology | Admitting: Obstetrics & Gynecology

## 2018-09-10 ENCOUNTER — Other Ambulatory Visit: Payer: Self-pay

## 2018-09-10 DIAGNOSIS — N644 Mastodynia: Secondary | ICD-10-CM

## 2018-09-11 ENCOUNTER — Other Ambulatory Visit: Payer: Self-pay | Admitting: Obstetrics & Gynecology

## 2018-09-11 DIAGNOSIS — Z1231 Encounter for screening mammogram for malignant neoplasm of breast: Secondary | ICD-10-CM

## 2018-09-11 NOTE — Progress Notes (Signed)
MRI of breasts ordered per radiology recommendations due to lifetime risk of breast cancer + 20%

## 2018-12-11 ENCOUNTER — Encounter: Payer: Self-pay | Admitting: Radiology

## 2018-12-17 ENCOUNTER — Encounter: Payer: Self-pay | Admitting: Family Medicine

## 2018-12-17 ENCOUNTER — Other Ambulatory Visit: Payer: Self-pay

## 2018-12-17 ENCOUNTER — Ambulatory Visit (INDEPENDENT_AMBULATORY_CARE_PROVIDER_SITE_OTHER): Payer: Managed Care, Other (non HMO) | Admitting: Family Medicine

## 2018-12-17 VITALS — BP 116/82 | HR 87 | Temp 97.3°F | Resp 16 | Wt 178.0 lb

## 2018-12-17 DIAGNOSIS — Z23 Encounter for immunization: Secondary | ICD-10-CM | POA: Diagnosis not present

## 2018-12-17 DIAGNOSIS — E119 Type 2 diabetes mellitus without complications: Secondary | ICD-10-CM

## 2018-12-17 DIAGNOSIS — E781 Pure hyperglyceridemia: Secondary | ICD-10-CM | POA: Diagnosis not present

## 2018-12-17 DIAGNOSIS — F17200 Nicotine dependence, unspecified, uncomplicated: Secondary | ICD-10-CM

## 2018-12-17 NOTE — Progress Notes (Signed)
Patient: Nancy Wood Female    DOB: Oct 04, 1970   48 y.o.   MRN: [id number] Visit Date: 12/17/2018  Today's Provider: Lelon Huh, MD   Chief Complaint  Patient presents with  . Diabetes  . Hyperlipidemia   Subjective:      HPI  Diabetes Mellitus Type II, Follow-up:   Lab Results  Component Value Date   HGBA1C 8.1 (H) 07/24/2018   HGBA1C 12.0 (H) 01/03/2018   HGBA1C 8.7 (H) 12/14/2015    Last seen for diabetes 5 months ago.  Management since then includes no change She reports good compliance with treatment. She is not having side effects.  Current symptoms include none and have been stable. Home blood sugar records: 130-180 Episodes of hypoglycemia? no              Most Recent Eye Exam: 04/03/2018 Weight trend: fluctuating   Current diet: in general, a well balanced diet   Current exercise: no exercise   Allergies  Allergen Reactions  . Vicodin [Hydrocodone-Acetaminophen] Nausea And Vomiting    5/325      Current Outpatient Medications:  .  glucose blood (CONTOUR NEXT TEST) test strip, Use as instructed to check fasting sugar daily. Dx Type 2 diabetes E11.9, Disp: 50 each, Rfl: 8 .  Lancets MISC, Use 1 lancet daily to check fingerstick blood sugar, Disp: 100 each, Rfl: 4 .  metFORMIN (GLUCOPHAGE-XR) 500 MG 24 hr tablet, Take 1 tablet (500 mg total) by mouth 2 (two) times daily., Disp: 60 tablet, Rfl: 11 .  pravastatin (PRAVACHOL) 40 MG tablet, Take 1 tablet (40 mg total) by mouth daily., Disp: 30 tablet, Rfl: 11 .  buPROPion (WELLBUTRIN SR) 150 MG 12 hr tablet, 1 tablet daily for 3 days, then 1 tablet twice daily. Stop smoking 14 days after starting medication, Disp: 60 tablet, Rfl: 11  Review of Systems  Constitutional: Negative.   Respiratory: Negative.   Cardiovascular: Negative.   Musculoskeletal: Negative.     Social History   Tobacco Use  . Smoking status: Current Every Day Smoker    Packs/day: 1.00    Years: 22.00   Pack years: 22.00    Types: Cigarettes  . Smokeless tobacco: Never Used  . Tobacco comment: per patient will try electronic ciarettes.  Substance Use Topics  . Alcohol use: No      Objective:   BP 116/82 (BP Location: Left Arm, Patient Position: Sitting, Cuff Size: Large)   Pulse 87   Temp (!) 97.3 F (36.3 C) (Temporal)   Resp 16   Wt 178 lb (80.7 kg)   LMP 06/01/2014   SpO2 98% Comment: room air  BMI 30.55 kg/m  Vitals:   12/17/18 0816  BP: 116/82  Pulse: 87  Resp: 16  Temp: (!) 97.3 F (36.3 C)  TempSrc: Temporal  SpO2: 98%  Weight: 178 lb (80.7 kg)  Body mass index is 30.55 kg/m.   Physical Exam   General Appearance:    Overweight female in no acute distress  Eyes:    PERRL, conjunctiva/corneas clear, EOM's intact       Lungs:     Clear to auscultation bilaterally, respirations unlabored  Heart:    Normal heart rate. Normal rhythm. No murmurs, rubs, or gallops.   MS:   All extremities are intact.   Neurologic:   Awake, alert, oriented x 3. No apparent focal neurological           defect.  Assessment & Plan    1. Diabetes mellitus without complication ([place]) Doing well with metformin - Urine Microalbumin w/creat. ratio - Pneumococcal polysaccharide vaccine 23-valent greater than or equal to 2yo subcutaneous/IM - Hemoglobin A1c  2. Hypertriglyceridemia She is tolerating pravastatin well with no adverse effects.   - Lipid panel - Comprehensive metabolic panel  3. Compulsive tobacco user syndrome Has not started bupropion, but has prescription and states she intends to start taking to help stop smoking.   4. Need for pneumococcal vaccination  - Pneumococcal polysaccharide vaccine 23-valent greater than or equal to 2yo subcutaneous/IM  5. Need for influenza vaccination  - Flu Vaccine QUAD 6+ mos PF IM (Fluarix Quad PF)   The entirety of the information documented in the History of Present Illness, Review of Systems and Physical Exam were  personally obtained by me. Portions of this information were initially documented by Meyer Cory, CMA and reviewed by me for thoroughness and accuracy.      Lelon Huh, MD  [facility]

## 2018-12-17 NOTE — Patient Instructions (Signed)
.   Please review the attached list of medications and notify my office if there are any errors.   . Please bring all of your medications to every appointment so we can make sure that our medication list is the same as yours.   . It is especially important to get the annual flu vaccine this year. If you haven't had it already, please go to your pharmacy or call the office as soon as possible to schedule you flu shot.  

## 2018-12-18 ENCOUNTER — Telehealth: Payer: Self-pay

## 2018-12-18 DIAGNOSIS — E119 Type 2 diabetes mellitus without complications: Secondary | ICD-10-CM

## 2018-12-18 LAB — COMPREHENSIVE METABOLIC PANEL
ALT: 31 IU/L (ref 0–32)
AST: 23 IU/L (ref 0–40)
Albumin/Globulin Ratio: 2.1 (ref 1.2–2.2)
Albumin: 5 g/dL — ABNORMAL HIGH (ref 3.8–4.8)
Alkaline Phosphatase: 75 IU/L (ref 39–117)
BUN/Creatinine Ratio: 17 (ref 9–23)
BUN: 15 mg/dL (ref 6–24)
Bilirubin Total: 0.4 mg/dL (ref 0.0–1.2)
CO2: 20 mmol/L (ref 20–29)
Calcium: 10.1 mg/dL (ref 8.7–10.2)
Chloride: 104 mmol/L (ref 96–106)
Creatinine, Ser: 0.86 mg/dL (ref 0.57–1.00)
GFR calc Af Amer: 91 mL/min/{1.73_m2} (ref >59)
GFR calc non Af Amer: 79 mL/min/{1.73_m2} (ref >59)
Globulin, Total: 2.4 g/dL (ref 1.5–4.5)
Glucose: 180 mg/dL — ABNORMAL HIGH (ref 65–99)
Potassium: 4.1 mmol/L (ref 3.5–5.2)
Sodium: 139 mmol/L (ref 134–144)
Total Protein: 7.4 g/dL (ref 6.0–8.5)

## 2018-12-18 LAB — LIPID PANEL
Chol/HDL Ratio: 2.9 ratio (ref 0.0–4.4)
Cholesterol, Total: 144 mg/dL (ref 100–199)
HDL: 49 mg/dL (ref >39)
LDL Chol Calc (NIH): 59 mg/dL (ref 0–99)
Triglycerides: 226 mg/dL — ABNORMAL HIGH (ref 0–149)
VLDL Cholesterol Cal: 36 mg/dL (ref 5–40)

## 2018-12-18 LAB — MICROALBUMIN / CREATININE URINE RATIO
Creatinine, Urine: 164.4 mg/dL
Microalb/Creat Ratio: 22 mg/g creat (ref 0–29)
Microalbumin, Urine: 36.9 ug/mL

## 2018-12-18 LAB — HEMOGLOBIN A1C
Est. average glucose Bld gHb Est-mCnc: 186 mg/dL
Hgb A1c MFr Bld: 8.1 % — ABNORMAL HIGH (ref 4.8–5.6)

## 2018-12-18 MED ORDER — METFORMIN HCL ER 500 MG PO TB24: ORAL_TABLET | ORAL | 4 refills | Status: DC

## 2018-12-18 NOTE — Telephone Encounter (Signed)
-----   Message from Birdie Sons, MD sent at 12/18/2018  7:44 AM EDT ----- A1c is unchanged at 8.1. need to get It below 7.5. Increase metformin to 500mg  every morning and 2 x 500mg  every evening for a total of 3 tablets a day. May send in prescription for #90 rf x 4. Cholesterol is well controlled at 144.  Schedule follow up in 3-4 months.

## 2018-12-18 NOTE — Telephone Encounter (Signed)
Patient advised. RX sent to [facility]. Follow up scheduled.

## 2018-12-19 ENCOUNTER — Encounter: Payer: Self-pay | Admitting: Family Medicine

## 2019-01-14 ENCOUNTER — Ambulatory Visit
Admission: RE | Admit: 2019-01-14 | Discharge: 2019-01-14 | Disposition: A | Discharge status: Home / Self Care | Payer: Managed Care, Other (non HMO) | Source: Ambulatory Visit

## 2019-01-14 ENCOUNTER — Other Ambulatory Visit: Payer: Self-pay

## 2019-01-14 DIAGNOSIS — Z1231 Encounter for screening mammogram for malignant neoplasm of breast: Secondary | ICD-10-CM

## 2019-01-21 ENCOUNTER — Other Ambulatory Visit: Payer: Self-pay

## 2019-01-21 ENCOUNTER — Other Ambulatory Visit: Payer: Self-pay | Admitting: Family Medicine

## 2019-01-21 DIAGNOSIS — Z1231 Encounter for screening mammogram for malignant neoplasm of breast: Secondary | ICD-10-CM

## 2019-02-05 ENCOUNTER — Telehealth: Payer: Self-pay | Admitting: Family Medicine

## 2019-02-05 NOTE — Telephone Encounter (Signed)
Medication was sent in on 12/18/18 for 90qty with 4 refills. Patient advised to contact pharmacy. KW

## 2019-02-05 NOTE — Telephone Encounter (Signed)
From [facility] 

## 2019-02-05 NOTE — Telephone Encounter (Signed)
Medication Refill - Medication: metFORMIN (GLUCOPHAGE-XR) 500 MG 24 hr tablet  Pt was changed to taking 1500Mg  a day and needs a new Rx sent to the pharmacy. Pt states it needs to be a 90 day supply to be covered by her insurance/ please advise   Has the patient contacted their pharmacy? No. (Agent: If no, request that the patient contact the pharmacy for the refill.) (Agent: If yes, when and what did the pharmacy advise?)  Preferred Pharmacy (with phone number or street name):  [facility], [place] Phone:  [phone]  Fax:  [phone]       Agent: Please be advised that RX refills may take up to 3 business days. We ask that you follow-up with your pharmacy.

## 2019-02-18 ENCOUNTER — Other Ambulatory Visit: Payer: Self-pay | Admitting: Family Medicine

## 2019-02-18 DIAGNOSIS — E781 Pure hyperglyceridemia: Secondary | ICD-10-CM

## 2019-02-19 ENCOUNTER — Ambulatory Visit (INDEPENDENT_AMBULATORY_CARE_PROVIDER_SITE_OTHER): Payer: Managed Care, Other (non HMO) | Admitting: Obstetrics & Gynecology

## 2019-02-19 ENCOUNTER — Encounter: Payer: Self-pay | Admitting: Obstetrics & Gynecology

## 2019-02-19 VITALS — BP 131/89 | HR 94 | Ht 64.5 in | Wt 178.0 lb

## 2019-02-19 DIAGNOSIS — Z01419 Encounter for gynecological examination (general) (routine) without abnormal findings: Secondary | ICD-10-CM

## 2019-02-19 NOTE — Progress Notes (Signed)
Subjective:    Nancy Wood is a 48 y.o. married P3 who presents for an annual exam. The patient has no complaints today. She used Intrarosa for several days and that vulvar itching resolved.  The patient is sexually active. GYN screening history: last pap: was normal. The patient wears seatbelts: yes. The patient participates in regular exercise: no. Has the patient ever been transfused or tattooed?: yes. The patient reports that there is not domestic violence in her life.   Menstrual History: OB History    Gravida  4   Para      Term      Preterm      AB  1   Living  3     SAB  1   TAB      Ectopic      Multiple      Live Births  45           Menarche age: 30 Patient's last menstrual period was 06/01/2014.    The following portions of the patient's history were reviewed and updated as appropriate: allergies, current medications, past family history, past medical history, past social history, past surgical history and problem list.  Review of Systems Pertinent items are noted in HPI.   Married for 23 years Works at [employer]- + breast in mom and sister, no gyn or colon cancer Aware that she needs colon screening   Objective:    BP 131/89   Pulse 94   Ht 5' 4.5" (1.638 m)   Wt 178 lb (80.7 kg)   LMP 06/01/2014   BMI 30.08 kg/m   General Appearance:    Alert, cooperative, no distress, appears stated age  Head:    Normocephalic, without obvious abnormality, atraumatic  Eyes:    PERRL, conjunctiva/corneas clear, EOM's intact, fundi    benign, both eyes  Ears:    Normal TM's and external ear canals, both ears  Nose:   Nares normal, septum midline, mucosa normal, no drainage    or sinus tenderness  Throat:   Lips, mucosa, and tongue normal; teeth and gums normal  Neck:   Supple, symmetrical, trachea midline, no adenopathy;    thyroid:  no enlargement/tenderness/nodules; no carotid   bruit or JVD  Back:     Symmetric, no curvature, ROM normal, no CVA  tenderness  Lungs:     Clear to auscultation bilaterally, respirations unlabored  Chest Wall:    No tenderness or deformity   Heart:    Regular rate and rhythm, S1 and S2 normal, no murmur, rub   or gallop  Breast Exam:    No tenderness, masses, or nipple abnormality  Abdomen:     Soft, non-tender, bowel sounds active all four quadrants,    no masses, no organomegaly  Genitalia:    Normal female without lesion, discharge or tenderness, normal size and shape, retroverted, mobile, non-tender, normal adnexal exam      Extremities:   Extremities normal, atraumatic, no cyanosis or edema  Pulses:   2+ and symmetric all extremities  Skin:   Skin color, texture, turgor normal, no rashes or lesions  Lymph nodes:   Cervical, supraclavicular, and axillary nodes normal  Neurologic:   CNII-XII intact, normal strength, sensation and reflexes    throughout  .    Assessment:    Healthy female exam.    Plan:     Thin prep Pap smear. with cotesting

## 2019-02-23 LAB — IGP, APTIMA HPV, RFX 16/18,45
HPV Aptima: NEGATIVE
PAP Smear Comment: 0

## 2019-03-04 ENCOUNTER — Other Ambulatory Visit: Payer: Self-pay | Admitting: Family Medicine

## 2019-03-04 DIAGNOSIS — E781 Pure hyperglyceridemia: Secondary | ICD-10-CM

## 2019-03-12 ENCOUNTER — Other Ambulatory Visit: Payer: Self-pay | Admitting: Family Medicine

## 2019-03-12 NOTE — Telephone Encounter (Signed)
Requested Prescriptions  Pending Prescriptions Disp Refills  . CONTOUR NEXT TEST test strip [Pharmacy Med Name: CONTOUR NEXT TEST STRIPS 50S] 50 strip     Sig: USE TO CHECK BLOOD SUGAR DAILY     Endocrinology: Diabetes - Testing Supplies Passed - 03/12/2019  8:55 AM      Passed - Valid encounter within last 12 months    Recent Outpatient Visits          2 months ago Diabetes mellitus without complication [facility])   [facility] Birdie Sons, MD   7 months ago Type 2 diabetes mellitus without complication, without long-term current use of insulin ([place])   [facility] Birdie Sons, MD   1 year ago Controlled type 2 diabetes mellitus with complication, without long-term current use of insulin [facility])   [facility] Birdie Sons, MD   1 year ago Annual physical exam   [facility] Birdie Sons, MD   3 years ago Hypertriglyceridemia   [facility] Birdie Sons, MD      Future Appointments            In 1 week Fisher, Kirstie Peri, MD [facility] [place]

## 2019-03-20 ENCOUNTER — Other Ambulatory Visit: Payer: Self-pay | Admitting: Family Medicine

## 2019-03-20 NOTE — Telephone Encounter (Signed)
Requested Prescriptions  Pending Prescriptions Disp Refills  . Microlet Lancets MISC [Pharmacy Med Name: MICROLET COLORED LANCETS 100] 100 each 4    Sig: USE TO CHECK BLOOD SUGAR DAILY     Endocrinology: Diabetes - Testing Supplies Passed - 03/20/2019 10:53 AM      Passed - Valid encounter within last 12 months    Recent Outpatient Visits          3 months ago Diabetes mellitus without complication [facility])   [facility] Birdie Sons, MD   7 months ago Type 2 diabetes mellitus without complication, without long-term current use of insulin ([place])   [facility] Birdie Sons, MD   1 year ago Controlled type 2 diabetes mellitus with complication, without long-term current use of insulin [facility])   [facility] Birdie Sons, MD   1 year ago Annual physical exam   [facility] Birdie Sons, MD   3 years ago Hypertriglyceridemia   [facility] Birdie Sons, MD      Future Appointments            In 2 weeks Fisher, Kirstie Peri, MD [facility], [place]

## 2019-03-22 ENCOUNTER — Ambulatory Visit: Payer: Managed Care, Other (non HMO) | Admitting: Family Medicine

## 2019-04-08 ENCOUNTER — Encounter: Payer: Self-pay | Admitting: Family Medicine

## 2019-04-08 ENCOUNTER — Other Ambulatory Visit: Payer: Self-pay

## 2019-04-08 ENCOUNTER — Ambulatory Visit: Payer: Managed Care, Other (non HMO) | Admitting: Family Medicine

## 2019-04-08 ENCOUNTER — Encounter: Payer: Self-pay | Admitting: Gastroenterology

## 2019-04-08 VITALS — BP 121/83 | HR 93 | Temp 96.9°F | Wt 177.2 lb

## 2019-04-08 DIAGNOSIS — Z1211 Encounter for screening for malignant neoplasm of colon: Secondary | ICD-10-CM

## 2019-04-08 DIAGNOSIS — E119 Type 2 diabetes mellitus without complications: Secondary | ICD-10-CM

## 2019-04-08 DIAGNOSIS — E781 Pure hyperglyceridemia: Secondary | ICD-10-CM | POA: Diagnosis not present

## 2019-04-08 NOTE — Progress Notes (Signed)
Patient: Nancy Wood Female    DOB: 03-Oct-1970   49 y.o.   MRN: [id number] Visit Date: 04/08/2019  Today's Provider: Lelon Huh, MD   Chief Complaint  Patient presents with  . Diabetes   Subjective:     HPI  Diabetes Mellitus Type II, Follow-up:  Recent Labs       Lab Results  Component Value Date   HGBA1C 8.1 (H) 07/24/2018   HGBA1C 12.0 (H) 01/03/2018   HGBA1C 8.7 (H) 12/14/2015      Last seen for diabetes20monthsago.  Management since then includesincrease metformin to 500mg  every morning and 2 x 500mg  every evening for a total of 3 tablets a day. Shereports goodcompliance with treatment. Sheis nothaving side effects.  Current symptoms includenone. Home blood sugar records:averaging 150's Episodes of hypoglycemia?no  Most Recent Eye Exam:04/03/2018 Weight trend:stable  Current diet:in general, a well balanced, healthy diet Current exercise:no exercise  Wt Readings from Last 3 Encounters:  04/08/19 177 lb 3.2 oz (80.4 kg)  02/19/19 178 lb (80.7 kg)  12/17/18 178 lb (80.7 kg)    Allergies  Allergen Reactions  . Vicodin [Hydrocodone-Acetaminophen] Nausea And Vomiting    5/325      Current Outpatient Medications:  .  CONTOUR NEXT TEST test strip, USE TO CHECK BLOOD SUGAR DAILY, Disp: 50 strip, Rfl: 1 .  metFORMIN (GLUCOPHAGE-XR) 500 MG 24 hr tablet, Take 1 tablet every morning, and 2 tablets every evening, Disp: 90 tablet, Rfl: 4 .  Microlet Lancets MISC, USE TO CHECK BLOOD SUGAR DAILY, Disp: 100 each, Rfl: 4 .  pravastatin (PRAVACHOL) 40 MG tablet, TAKE 1 TABLET(40 MG) BY MOUTH DAILY, Disp: 30 tablet, Rfl: 2 .  buPROPion (WELLBUTRIN SR) 150 MG 12 hr tablet, 1 tablet daily for 3 days, then 1 tablet twice daily. Stop smoking 14 days after starting medication, Disp: 60 tablet, Rfl: 11  Review of Systems  Constitutional: Negative.   Respiratory: Negative.   Cardiovascular: Negative.   Endocrine:  Negative.   Musculoskeletal: Negative.     Social History   Tobacco Use  . Smoking status: Current Every Day Smoker    Packs/day: 1.00    Years: 22.00    Pack years: 22.00    Types: Cigarettes  . Smokeless tobacco: Never Used  . Tobacco comment: per patient will try electronic ciarettes.  Substance Use Topics  . Alcohol use: No      Objective:   BP 121/83 (BP Location: Right Arm, Patient Position: Sitting, Cuff Size: Normal)   Pulse 93   Temp (!) 96.9 F (36.1 C) (Temporal)   Wt 177 lb 3.2 oz (80.4 kg)   LMP 06/01/2014   BMI 29.95 kg/m  Vitals:   04/08/19 0838  BP: 121/83  Pulse: 93  Temp: (!) 96.9 F (36.1 C)  TempSrc: Temporal  Weight: 177 lb 3.2 oz (80.4 kg)  Body mass index is 29.95 kg/m.   Physical Exam  General appearance:  Overweight female, cooperative and in no acute distress Head: Normocephalic, without obvious abnormality, atraumatic Respiratory: Respirations even and unlabored, normal respiratory rate Extremities: All extremities are intact.  Skin: Skin color, texture, turgor normal. No rashes seen  Psych: Appropriate mood and affect. Neurologic: Mental status: Alert, oriented to person, place, and time, thought content appropriate.       Assessment & Plan    1. Type 2 diabetes mellitus without complication, without long-term current use of insulin ([facility]) Doing well on current dose of metformin -  Hemoglobin A1c  2. Hypertriglyceridemia She is tolerating pravastatin well with no adverse effects.   - Comprehensive metabolic panel - Lipid panel - CBC  3. Colon cancer screening  - Ambulatory referral to Gastroenterology  The entirety of the information documented in the History of Present Illness, Review of Systems and Physical Exam were personally obtained by me. Portions of this information were initially documented by Idelle Jo, CMA and reviewed by me for thoroughness and accuracy.     Lelon Huh, MD  [facility]

## 2019-04-09 LAB — LIPID PANEL
Chol/HDL Ratio: 2.6 ratio (ref 0.0–4.4)
Cholesterol, Total: 139 mg/dL (ref 100–199)
HDL: 53 mg/dL (ref >39)
LDL Chol Calc (NIH): 52 mg/dL (ref 0–99)
Triglycerides: 211 mg/dL — ABNORMAL HIGH (ref 0–149)
VLDL Cholesterol Cal: 34 mg/dL (ref 5–40)

## 2019-04-09 LAB — COMPREHENSIVE METABOLIC PANEL
ALT: 29 IU/L (ref 0–32)
AST: 20 IU/L (ref 0–40)
Albumin/Globulin Ratio: 2 (ref 1.2–2.2)
Albumin: 5 g/dL — ABNORMAL HIGH (ref 3.8–4.8)
Alkaline Phosphatase: 67 IU/L (ref 39–117)
BUN/Creatinine Ratio: 18 (ref 9–23)
BUN: 14 mg/dL (ref 6–24)
Bilirubin Total: 0.2 mg/dL (ref 0.0–1.2)
CO2: 18 mmol/L — ABNORMAL LOW (ref 20–29)
Calcium: 10.4 mg/dL — ABNORMAL HIGH (ref 8.7–10.2)
Chloride: 102 mmol/L (ref 96–106)
Creatinine, Ser: 0.77 mg/dL (ref 0.57–1.00)
GFR calc Af Amer: 104 mL/min/{1.73_m2} (ref >59)
GFR calc non Af Amer: 90 mL/min/{1.73_m2} (ref >59)
Globulin, Total: 2.5 g/dL (ref 1.5–4.5)
Glucose: 169 mg/dL — ABNORMAL HIGH (ref 65–99)
Potassium: 4.3 mmol/L (ref 3.5–5.2)
Sodium: 138 mmol/L (ref 134–144)
Total Protein: 7.5 g/dL (ref 6.0–8.5)

## 2019-04-09 LAB — CBC
Hematocrit: 45.2 % (ref 34.0–46.6)
Hemoglobin: 14.9 g/dL (ref 11.1–15.9)
MCH: 28 pg (ref 26.6–33.0)
MCHC: 33 g/dL (ref 31.5–35.7)
MCV: 85 fL (ref 79–97)
Platelets: 259 10*3/uL (ref 150–450)
RBC: 5.32 x10E6/uL — ABNORMAL HIGH (ref 3.77–5.28)
RDW: 12.8 % (ref 11.7–15.4)
WBC: 5.8 10*3/uL (ref 3.4–10.8)

## 2019-04-09 LAB — HEMOGLOBIN A1C
Est. average glucose Bld gHb Est-mCnc: 174 mg/dL
Hgb A1c MFr Bld: 7.7 % — ABNORMAL HIGH (ref 4.8–5.6)

## 2019-04-29 ENCOUNTER — Other Ambulatory Visit: Payer: Self-pay

## 2019-04-29 ENCOUNTER — Ambulatory Visit (AMBULATORY_SURGERY_CENTER): Payer: Self-pay

## 2019-04-29 VITALS — Temp 97.1°F | Ht 64.5 in | Wt 179.0 lb

## 2019-04-29 DIAGNOSIS — Z01818 Encounter for other preprocedural examination: Secondary | ICD-10-CM

## 2019-04-29 DIAGNOSIS — Z1211 Encounter for screening for malignant neoplasm of colon: Secondary | ICD-10-CM

## 2019-04-29 MED ORDER — NA SULFATE-K SULFATE-MG SULF 17.5-3.13-1.6 GM/177ML PO SOLN: 1.0000 | Freq: Once | ORAL | 0 refills | Status: AC

## 2019-04-29 NOTE — Progress Notes (Signed)

## 2019-04-30 ENCOUNTER — Telehealth: Payer: Self-pay | Admitting: Gastroenterology

## 2019-04-30 DIAGNOSIS — Z1211 Encounter for screening for malignant neoplasm of colon: Secondary | ICD-10-CM

## 2019-04-30 MED ORDER — PEG 3350-KCL-NA BICARB-NACL 420 G PO SOLR: 4000.0000 mL | Freq: Once | ORAL | 0 refills | Status: AC

## 2019-04-30 NOTE — Telephone Encounter (Signed)
Pt reported that prep soln is $70 and requested a cheaper alternative.

## 2019-04-30 NOTE — Telephone Encounter (Signed)
Apply Coupon=BIN: [id number] PCN: [id number]  [facility] did not run coupon code sent in with script  Price 69. Plus per [facility]   Per [facility] coupon dropped  Price to 63.60  I explained to her we can do Golytely and its cheaper and will need new instructions- she is okay with the prep and amount to drink with a low cost- I will send her new Golytely instructions via her My Chart   golytely prep sent  to [facility]

## 2019-05-09 ENCOUNTER — Encounter: Payer: Self-pay | Admitting: Gastroenterology

## 2019-05-09 ENCOUNTER — Ambulatory Visit (INDEPENDENT_AMBULATORY_CARE_PROVIDER_SITE_OTHER): Payer: Managed Care, Other (non HMO)

## 2019-05-09 ENCOUNTER — Other Ambulatory Visit: Payer: Self-pay | Admitting: Gastroenterology

## 2019-05-09 DIAGNOSIS — Z1159 Encounter for screening for other viral diseases: Secondary | ICD-10-CM

## 2019-05-10 LAB — SARS CORONAVIRUS 2 (TAT 6-24 HRS): SARS Coronavirus 2: NEGATIVE

## 2019-05-13 ENCOUNTER — Other Ambulatory Visit: Payer: Self-pay

## 2019-05-13 ENCOUNTER — Ambulatory Visit (AMBULATORY_SURGERY_CENTER): Payer: Managed Care, Other (non HMO) | Admitting: Gastroenterology

## 2019-05-13 ENCOUNTER — Encounter: Payer: Self-pay | Admitting: Gastroenterology

## 2019-05-13 ENCOUNTER — Other Ambulatory Visit: Payer: Self-pay | Admitting: Family Medicine

## 2019-05-13 VITALS — BP 129/96 | HR 86 | Temp 96.8°F | Resp 15 | Wt 179.0 lb

## 2019-05-13 DIAGNOSIS — D122 Benign neoplasm of ascending colon: Secondary | ICD-10-CM

## 2019-05-13 DIAGNOSIS — Z1211 Encounter for screening for malignant neoplasm of colon: Secondary | ICD-10-CM

## 2019-05-13 DIAGNOSIS — K635 Polyp of colon: Secondary | ICD-10-CM

## 2019-05-13 DIAGNOSIS — Z860101 Personal history of adenomatous and serrated colon polyps: Secondary | ICD-10-CM | POA: Insufficient documentation

## 2019-05-13 DIAGNOSIS — Z86010 Personal history of colonic polyps: Secondary | ICD-10-CM | POA: Insufficient documentation

## 2019-05-13 DIAGNOSIS — E119 Type 2 diabetes mellitus without complications: Secondary | ICD-10-CM

## 2019-05-13 MED ORDER — SODIUM CHLORIDE 0.9 % IV SOLN: 500.0000 mL | Freq: Once | INTRAVENOUS | Status: DC

## 2019-05-13 MED ORDER — METFORMIN HCL ER 500 MG PO TB24: ORAL_TABLET | ORAL | 4 refills | Status: DC

## 2019-05-13 NOTE — Op Note (Signed)
[place] Patient Name: Nancy Wood Procedure Date: 05/13/2019 9:48 AM MRN: [id number] Endoscopist: Ladene Artist , MD Age: 49 Referring MD:  Date of Birth: 10/07/1970 Gender: Female Account #: [id number] Procedure:                Colonoscopy Indications:              Screening for colorectal malignant neoplasm Medicines:                Monitored Anesthesia Care Procedure:                Pre-Anesthesia Assessment:                           - Prior to the procedure, a History and Physical                            was performed, and patient medications and                            allergies were reviewed. The patient's tolerance of                            previous anesthesia was also reviewed. The risks                            and benefits of the procedure and the sedation                            options and risks were discussed with the patient.                            All questions were answered, and informed consent                            was obtained. Prior Anticoagulants: The patient has                            taken no previous anticoagulant or antiplatelet                            agents. ASA Grade Assessment: II - A patient with                            mild systemic disease. After reviewing the risks                            and benefits, the patient was deemed in                            satisfactory condition to undergo the procedure.                           After obtaining informed consent, the colonoscope  was passed under direct vision. Throughout the                            procedure, the patient's blood pressure, pulse, and                            oxygen saturations were monitored continuously. The                            Colonoscope was introduced through the anus and                            advanced to the the cecum, identified by                            appendiceal orifice  and ileocecal valve. The                            ileocecal valve, appendiceal orifice, and rectum                            were photographed. The quality of the bowel                            preparation was good. The colonoscopy was performed                            without difficulty. The patient tolerated the                            procedure well. Scope In: 9:55:51 AM Scope Out: 10:09:16 AM Scope Withdrawal Time: 0 hours 8 minutes 56 seconds  Total Procedure Duration: 0 hours 13 minutes 25 seconds  Findings:                 The perianal and digital rectal examinations were                            normal.                           A 7 mm polyp was found in the ascending colon. The                            polyp was sessile. The polyp was removed with a                            cold snare. Resection and retrieval were complete.                           Internal hemorrhoids were found during                            retroflexion. The hemorrhoids were small and Grade  I (internal hemorrhoids that do not prolapse).                           The exam was otherwise without abnormality on                            direct and retroflexion views. Complications:            No immediate complications. Estimated blood loss:                            None. Estimated Blood Loss:     Estimated blood loss: none. Impression:               - One 7 mm polyp in the ascending colon, removed                            with a cold snare. Resected and retrieved.                           - Internal hemorrhoids.                           - The examination was otherwise normal on direct                            and retroflexion views. Recommendation:           - Repeat colonoscopy after studies are complete for                            surveillance based on pathology results.                           - Patient has a contact number available for                             emergencies. The signs and symptoms of potential                            delayed complications were discussed with the                            patient. Return to normal activities tomorrow.                            Written discharge instructions were provided to the                            patient.                           - Resume previous diet.                           - Continue present medications.                           -  Await pathology results. Ladene Artist, MD 05/13/2019 10:13:14 AM This report has been signed electronically.

## 2019-05-13 NOTE — Progress Notes (Signed)
A and O x3. Report to RN. Tolerated MAC anesthesia well.

## 2019-05-13 NOTE — Telephone Encounter (Signed)
[facility] faxed refill request for the following medications:  metFORMIN (GLUCOPHAGE-XR) 500 MG 24 hr tablet   Last Rx: 11/2018 LOV: 04/08/2019 Please advise. Thanks TNP

## 2019-05-13 NOTE — Patient Instructions (Signed)
Impression/Recommendations:  Polyp handout given to patient. Hemorrhoid handout given to patient.  Repeat colonoscopy after pathology results are reviewed.  Resume previous diet. Continue present medications.  Await pathology results.  YOU HAD AN ENDOSCOPIC PROCEDURE TODAY AT [facility]:   Refer to the procedure report that was given to you for any specific questions about what was found during the examination.  If the procedure report does not answer your questions, please call your gastroenterologist to clarify.  If you requested that your care partner not be given the details of your procedure findings, then the procedure report has been included in a sealed envelope for you to review at your convenience later.  YOU SHOULD EXPECT: Some feelings of bloating in the abdomen. Passage of more gas than usual.  Walking can help get rid of the air that was put into your GI tract during the procedure and reduce the bloating. If you had a lower endoscopy (such as a colonoscopy or flexible sigmoidoscopy) you may notice spotting of blood in your stool or on the toilet paper. If you underwent a bowel prep for your procedure, you may not have a normal bowel movement for a few days.  Please Note:  You might notice some irritation and congestion in your nose or some drainage.  This is from the oxygen used during your procedure.  There is no need for concern and it should clear up in a day or so.  SYMPTOMS TO REPORT IMMEDIATELY:   Following lower endoscopy (colonoscopy or flexible sigmoidoscopy):  Excessive amounts of blood in the stool  Significant tenderness or worsening of abdominal pains  Swelling of the abdomen that is new, acute  Fever of 100F or higher  For urgent or emergent issues, a gastroenterologist can be reached at any hour by calling [phone]. Do not use MyChart messaging for urgent concerns.    DIET:  We do recommend a small meal at first, but then you may  proceed to your regular diet.  Drink plenty of fluids but you should avoid alcoholic beverages for 24 hours.  ACTIVITY:  You should plan to take it easy for the rest of today and you should NOT DRIVE or use heavy machinery until tomorrow (because of the sedation medicines used during the test).    FOLLOW UP: Our staff will call the number listed on your records 48-72 hours following your procedure to check on you and address any questions or concerns that you may have regarding the information given to you following your procedure. If we do not reach you, we will leave a message.  We will attempt to reach you two times.  During this call, we will ask if you have developed any symptoms of COVID 19. If you develop any symptoms (ie: fever, flu-like symptoms, shortness of breath, cough etc.) before then, please call [phone].  If you test positive for Covid 19 in the 2 weeks post procedure, please call and report this information to [place].    If any biopsies were taken you will be contacted by phone or by letter within the next 1-3 weeks.  Please call us at [phone] if you have not heard about the biopsies in 3 weeks.    SIGNATURES/CONFIDENTIALITY: You and/or your care partner have signed paperwork which will be entered into your electronic medical record.  These signatures attest to the fact that that the information above on your After Visit Summary has been reviewed and is understood.  Full responsibility  of the confidentiality of this discharge information lies with you and/or your care-partner.

## 2019-05-13 NOTE — Progress Notes (Signed)
Temp by LC Vitals by CW   Pt's states no medical or surgical changes since previsit or office visit.  

## 2019-05-13 NOTE — Progress Notes (Signed)
Called to room to assist during endoscopic procedure.  Patient ID and intended procedure confirmed with present staff. Received instructions for my participation in the procedure from the performing physician.  

## 2019-05-15 ENCOUNTER — Telehealth: Payer: Self-pay | Admitting: *Deleted

## 2019-05-15 NOTE — Telephone Encounter (Signed)
1. Have you developed a fever since your procedure? no  2.   Have you had an respiratory symptoms (SOB or cough) since your procedure? no  3.   Have you tested positive for COVID 19 since your procedure no  4.   Have you had any family members/close contacts diagnosed with the COVID 19 since your procedure?  no   If yes to any of these questions please route to Joylene John, RN and Erenest Rasher, RN Follow up Call-  Call back number 05/13/2019  Post procedure Call Back phone  # [phone]  Permission to leave phone message Yes  Some recent data might be hidden     Patient questions:  Do you have a fever, pain , or abdominal swelling? No. Pain Score  0 *  Have you tolerated food without any problems? Yes.    Have you been able to return to your normal activities? Yes.    Do you have any questions about your discharge instructions: Diet   No. Medications  No. Follow up visit  No.  Do you have questions or concerns about your Care? No.  Actions: * If pain score is 4 or above: No action needed, pain <4.

## 2019-05-19 ENCOUNTER — Other Ambulatory Visit: Payer: Self-pay | Admitting: Family Medicine

## 2019-05-19 DIAGNOSIS — E781 Pure hyperglyceridemia: Secondary | ICD-10-CM

## 2019-05-19 NOTE — Telephone Encounter (Signed)
Requested Prescriptions  Pending Prescriptions Disp Refills  . pravastatin (PRAVACHOL) 40 MG tablet [Pharmacy Med Name: PRAVASTATIN 40MG  TABLETS] 30 tablet 2    Sig: TAKE 1 TABLET(40 MG) BY MOUTH DAILY     Cardiovascular:  Antilipid - Statins Failed - 05/19/2019  7:37 AM      Failed - LDL in normal range and within 360 days    LDL Chol Calc (NIH)  Date Value Ref Range Status  04/08/2019 52 0 - 99 mg/dL Final         Failed - Triglycerides in normal range and within 360 days    Triglycerides  Date Value Ref Range Status  04/08/2019 211 (H) 0 - 149 mg/dL Final         Passed - Total Cholesterol in normal range and within 360 days    Cholesterol, Total  Date Value Ref Range Status  04/08/2019 139 100 - 199 mg/dL Final         Passed - HDL in normal range and within 360 days    HDL  Date Value Ref Range Status  04/08/2019 53 >39 mg/dL Final         Passed - Patient is not pregnant      Passed - Valid encounter within last 12 months    Recent Outpatient Visits          1 month ago Type 2 diabetes mellitus without complication, without long-term current use of insulin ([place])   [facility] Birdie Sons, MD   5 months ago Diabetes mellitus without complication [facility])   [facility] Birdie Sons, MD   9 months ago Type 2 diabetes mellitus without complication, without long-term current use of insulin [facility])   [facility] Birdie Sons, MD   1 year ago Controlled type 2 diabetes mellitus with complication, without long-term current use of insulin [facility])   [facility] Birdie Sons, MD   1 year ago Annual physical exam   [facility] Birdie Sons, MD

## 2019-05-22 ENCOUNTER — Encounter: Payer: Self-pay | Admitting: Gastroenterology

## 2019-05-23 ENCOUNTER — Encounter: Payer: Self-pay | Admitting: Family Medicine

## 2019-08-09 ENCOUNTER — Other Ambulatory Visit: Payer: Self-pay | Admitting: Family Medicine

## 2019-08-09 DIAGNOSIS — E119 Type 2 diabetes mellitus without complications: Secondary | ICD-10-CM

## 2019-08-09 MED ORDER — METFORMIN HCL ER 500 MG PO TB24: ORAL_TABLET | ORAL | 1 refills | Status: DC

## 2019-08-09 NOTE — Telephone Encounter (Signed)
Medication Refill - Medication: Metformin   Has the patient contacted their pharmacy? Yes.   PT called and is requesting to have a 90 day supply os medication sent in to pharmacy as insurance will only cover for 90 days. Pt is requesting to also have refills called into the pharmacy in 90 day supplies. Please advise.  (Agent: If no, request that the patient contact the pharmacy for the refill.) (Agent: If yes, when and what did the pharmacy advise?)  Preferred Pharmacy (with phone number or street name):  [facility] [place] [facility] [address]  [place] [address]  Phone: [phone] Fax: [phone]  Hours: Not open 24 hours     Agent: Please be advised that RX refills may take up to 3 business days. We ask that you follow-up with your pharmacy.

## 2019-08-17 ENCOUNTER — Other Ambulatory Visit: Payer: Self-pay | Admitting: Family Medicine

## 2019-08-17 DIAGNOSIS — E781 Pure hyperglyceridemia: Secondary | ICD-10-CM

## 2019-09-02 ENCOUNTER — Telehealth: Payer: Self-pay

## 2019-09-02 DIAGNOSIS — M545 Low back pain, unspecified: Secondary | ICD-10-CM

## 2019-09-02 MED ORDER — CYCLOBENZAPRINE HCL 5 MG PO TABS: 5.0000 mg | ORAL_TABLET | Freq: Three times a day (TID) | ORAL | 1 refills | Status: DC | PRN
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
## 2019-09-02 NOTE — Telephone Encounter (Signed)
Show apply ice pack 3 times a day for three days, then alternate heat and ice. Have sent prescription for cyclobenzaprine to her pharmacy. Need o.v. if not better in 10-14 days.

## 2019-09-03 NOTE — Telephone Encounter (Signed)
Patient advised.

## 2019-09-11 ENCOUNTER — Telehealth: Payer: Self-pay

## 2019-09-11 NOTE — Telephone Encounter (Signed)
Called patient and she said her symptoms at this point are mild.  She is having head and chest congestion, some headache and today had diarrhea.  She started having symptoms on 7/19 which progressed and today she tested positive.  Her son and daughter in law live with her and they are both positive and tested over the weekend after the daughter in laws family, mother, father and grandmother were positive.  Patient's husband and granddaughter who also live in the house do not have any symptoms and her husband is fully vaccinated.  Patient was advised on treating her symptoms and to call if worsening.  She states that she called the health department prior to her developing symptoms, when her son first tested positive and was told she did not have to quarentine.  Advised that now that she has symptoms she should. Patient verbalized understanding and will call for new or worsening symptoms.   Copied from [place] [id number]. 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

## 2019-09-13 ENCOUNTER — Telehealth: Payer: Self-pay

## 2019-09-13 DIAGNOSIS — U071 COVID-19: Secondary | ICD-10-CM

## 2019-09-13 NOTE — Telephone Encounter (Signed)
Received forms from [place]. Forms are in Dr. Maralyn Sago box. Please advise. Thanks TNP

## 2019-09-17 NOTE — Telephone Encounter (Signed)
Need to know medical reason she missed work, why on how this prevented her from doing her essential job functions. Need to know what days she was out of work and date she returned to work.

## 2019-09-17 NOTE — Telephone Encounter (Signed)
I called and spoke with patient. Here are her responses to the questions:   1) Need to know medical reason she missed work, why on how this prevented her from doing her essential job functions.  -Patient went to [facility] on 09/11/2019 and tested positive for COVID which prevented her from returning to work.   2) Need to know what days she was out of work and date she returned to work.  -Patient has been out of work since 09/11/2019 until present. She was told that she has to quarantine. She will return to work on 09/23/2019

## 2019-09-18 DIAGNOSIS — U071 COVID-19: Secondary | ICD-10-CM | POA: Insufficient documentation

## 2019-09-18 NOTE — Telephone Encounter (Signed)
FMLA form completed.

## 2019-09-18 NOTE — Telephone Encounter (Signed)
Forms have been completed and faxed. Pt advised. TNP

## 2019-11-04 ENCOUNTER — Other Ambulatory Visit: Payer: Self-pay | Admitting: Family Medicine

## 2019-12-08 IMAGING — MG DIGITAL SCREENING BILAT W/ TOMO W/ CAD
8 series · 8 of 24 positions shown · non-contrast
Comparison: Previous exam(s).

CLINICAL DATA: Screening.

EXAM:
DIGITAL SCREENING BILATERAL MAMMOGRAM WITH TOMO AND CAD

[L CC synth-2D]
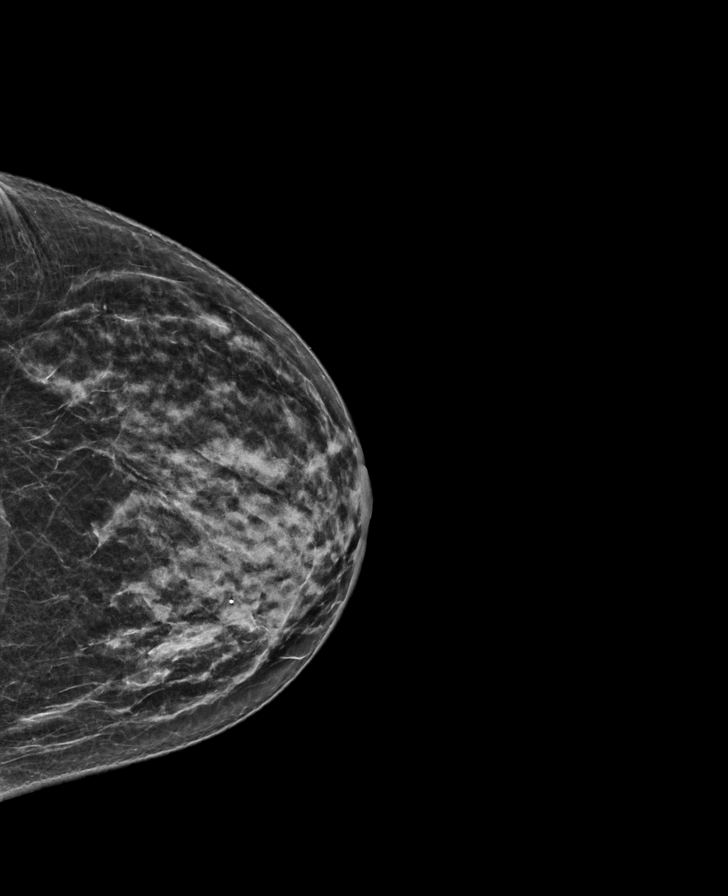

[R MLO synth-2D]
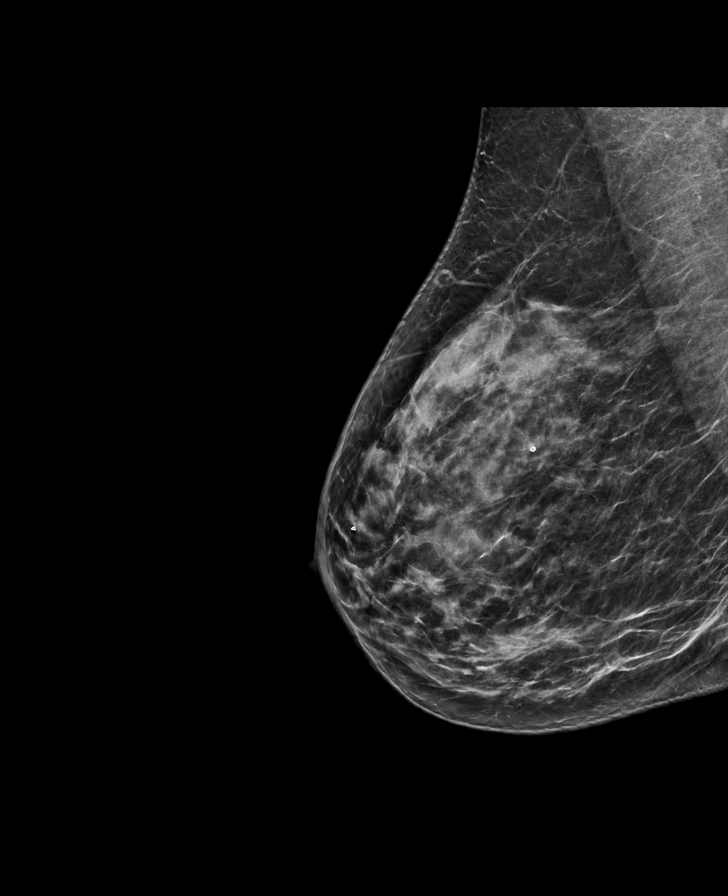

[L MLO synth-2D]
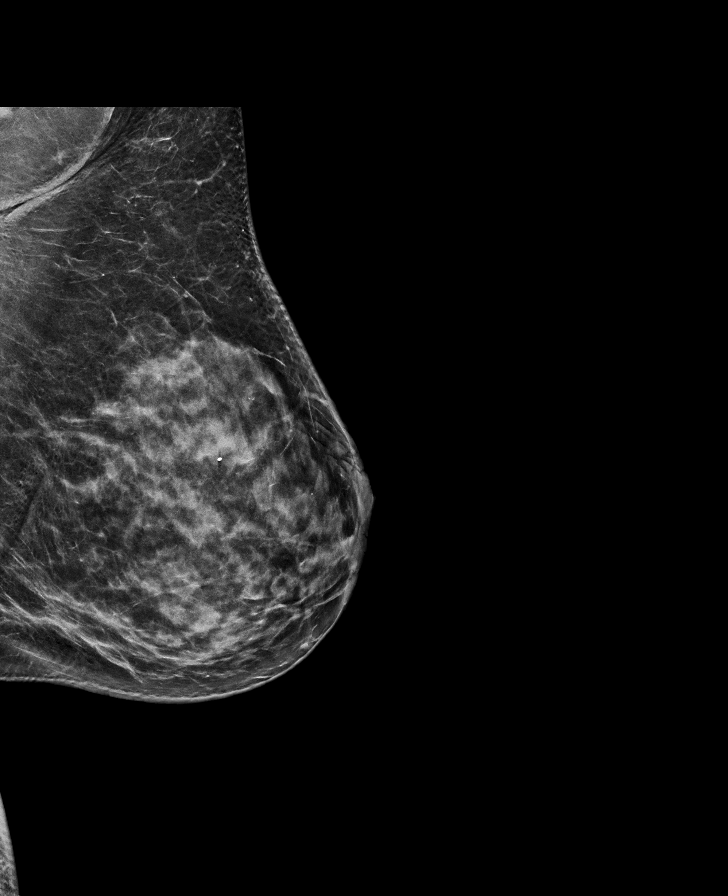

[R CC synth-2D]
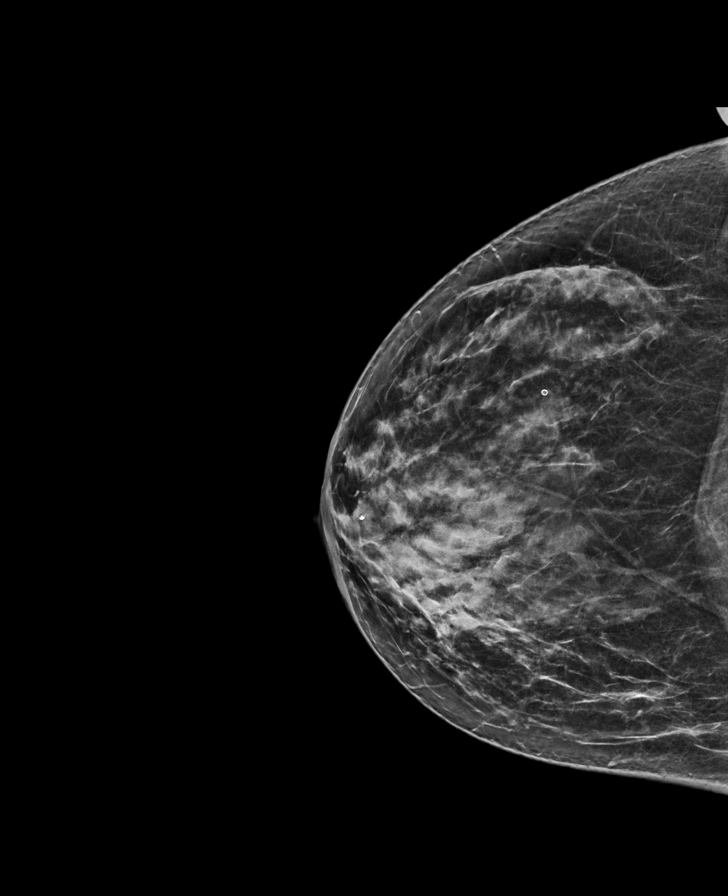

[R CC tomo · tomo slice 33/65.0]
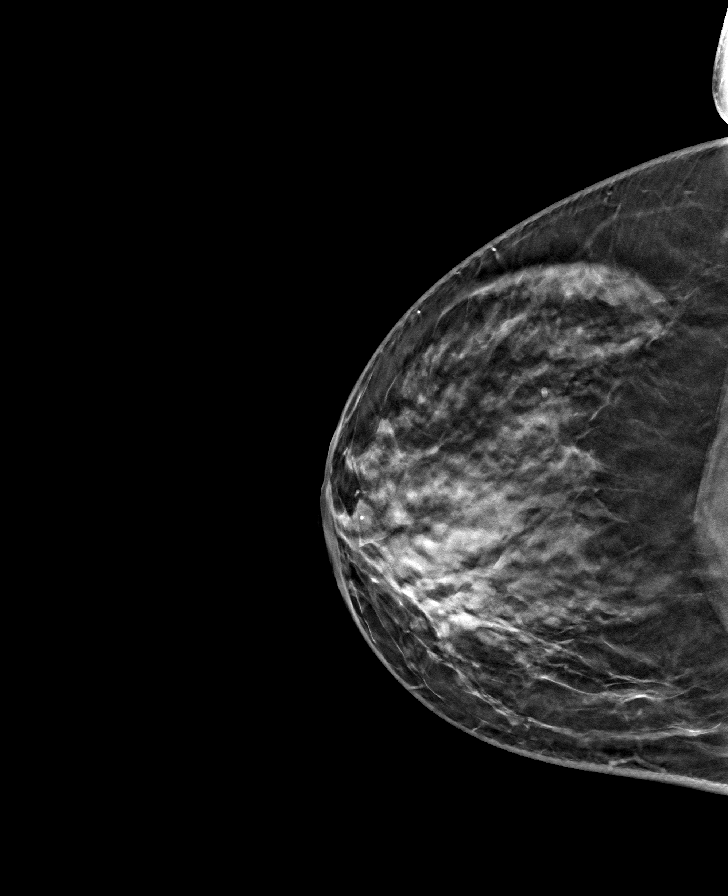

[R MLO tomo · tomo slice 34/67.0]
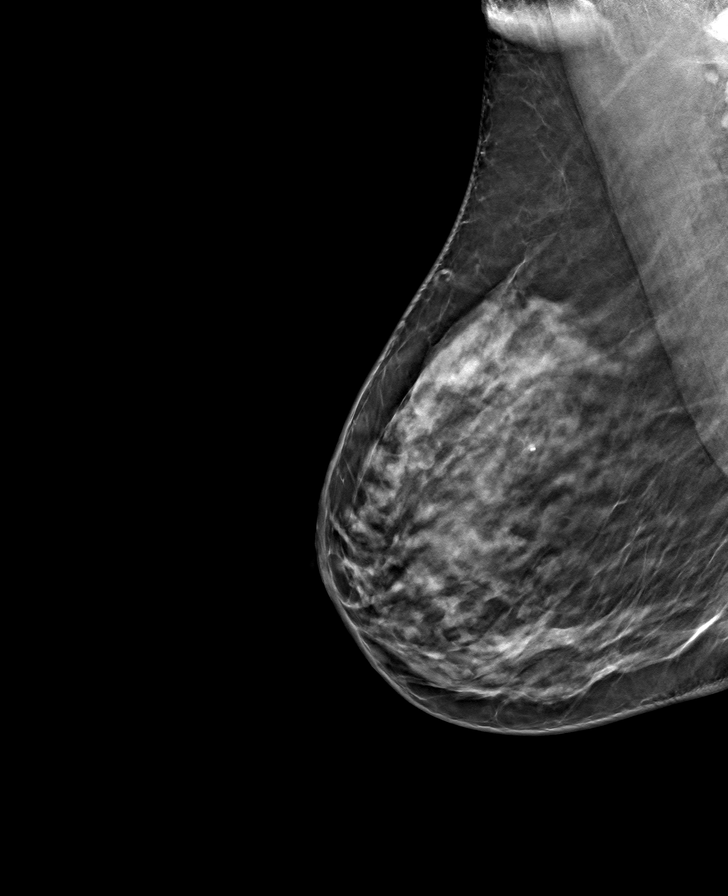

[L CC tomo · tomo slice 29/57.0]
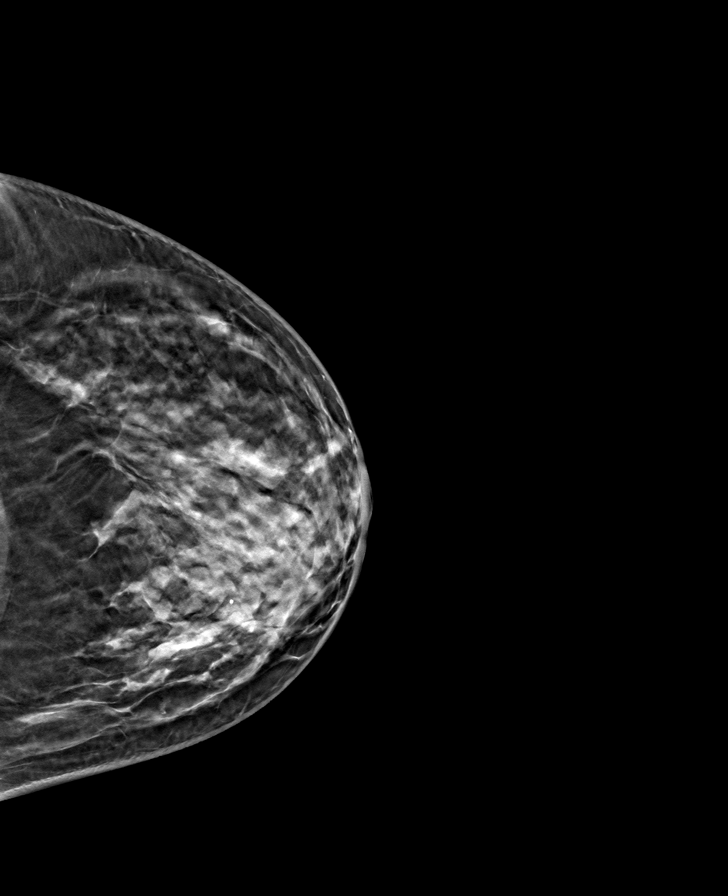

[L MLO tomo · tomo slice 33/64.0]
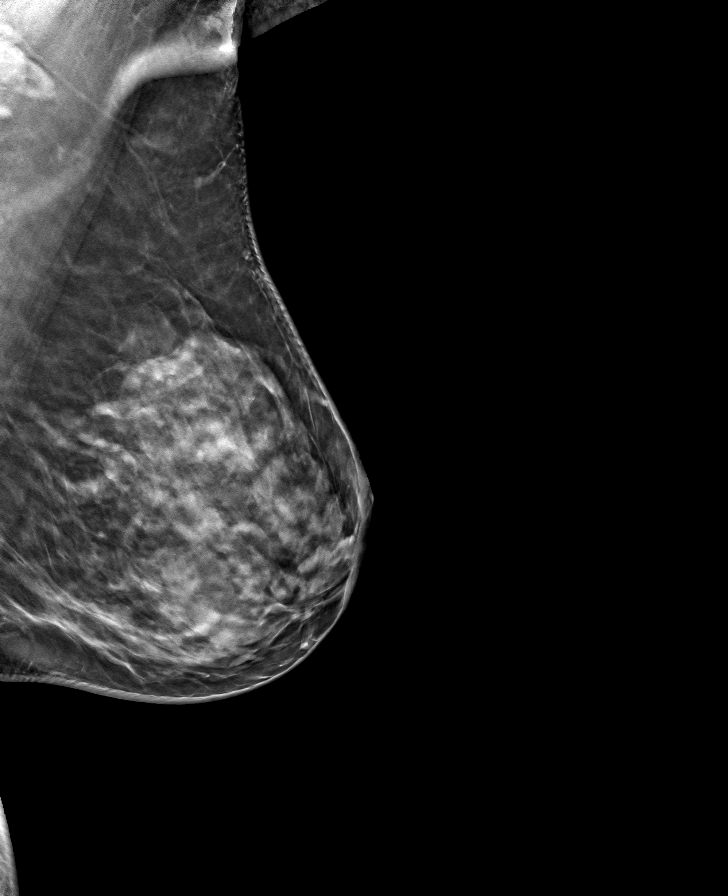

[8 of 24 positions shown; findings below may reference images not displayed]

ACR Breast Density Category c: The breast tissue is heterogeneously
dense, which may obscure small masses.
FINDINGS: There are no findings suspicious for malignancy. Images were
processed with CAD.
IMPRESSION: No mammographic evidence of malignancy. A result letter of this
screening mammogram will be mailed directly to the patient.

RECOMMENDATION:
Screening mammogram in one year. (Code:[id number])

BI-RADS CATEGORY  1: Negative.

## 2019-12-20 ENCOUNTER — Encounter: Payer: Self-pay | Admitting: Radiology

## 2020-01-21 ENCOUNTER — Other Ambulatory Visit: Payer: Self-pay | Admitting: Family Medicine

## 2020-01-21 DIAGNOSIS — Z1231 Encounter for screening mammogram for malignant neoplasm of breast: Secondary | ICD-10-CM

## 2020-01-31 ENCOUNTER — Ambulatory Visit
Admission: RE | Admit: 2020-01-31 | Discharge: 2020-01-31 | Disposition: A | Discharge status: Home / Self Care | Payer: Managed Care, Other (non HMO) | Source: Ambulatory Visit

## 2020-01-31 ENCOUNTER — Other Ambulatory Visit: Payer: Self-pay

## 2020-01-31 DIAGNOSIS — Z1231 Encounter for screening mammogram for malignant neoplasm of breast: Secondary | ICD-10-CM

## 2020-02-07 ENCOUNTER — Other Ambulatory Visit: Payer: Self-pay | Admitting: Family Medicine

## 2020-02-07 DIAGNOSIS — E119 Type 2 diabetes mellitus without complications: Secondary | ICD-10-CM

## 2020-02-07 MED ORDER — METFORMIN HCL ER 500 MG PO TB24
ORAL_TABLET | ORAL | 4 refills | Status: DC
Start: 2020-02-07 — End: 2020-10-20

## 2020-02-13 ENCOUNTER — Other Ambulatory Visit: Payer: Self-pay | Admitting: Family Medicine

## 2020-02-13 DIAGNOSIS — E781 Pure hyperglyceridemia: Secondary | ICD-10-CM

## 2020-03-26 ENCOUNTER — Other Ambulatory Visit: Payer: Self-pay | Admitting: Family Medicine

## 2020-07-11 ENCOUNTER — Ambulatory Visit
Admission: EM | Admit: 2020-07-11 | Discharge: 2020-07-11 | Disposition: A | Discharge status: Home / Self Care | Payer: Managed Care, Other (non HMO) | Attending: Family Medicine | Admitting: Family Medicine

## 2020-07-11 ENCOUNTER — Other Ambulatory Visit: Payer: Self-pay

## 2020-07-11 DIAGNOSIS — J209 Acute bronchitis, unspecified: Secondary | ICD-10-CM

## 2020-07-11 DIAGNOSIS — J019 Acute sinusitis, unspecified: Secondary | ICD-10-CM

## 2020-07-11 MED ORDER — PREDNISONE 10 MG PO TABS: 10.0000 mg | ORAL_TABLET | Freq: Every day | ORAL | 0 refills | Status: AC

## 2020-07-11 MED ORDER — PROMETHAZINE-DM 6.25-15 MG/5ML PO SYRP: 5.0000 mL | ORAL_SOLUTION | Freq: Three times a day (TID) | ORAL | 0 refills | Status: DC | PRN

## 2020-07-11 MED ORDER — DOXYCYCLINE HYCLATE 100 MG PO CAPS: 100.0000 mg | ORAL_CAPSULE | Freq: Two times a day (BID) | ORAL | 0 refills | Status: AC
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
## 2020-07-11 NOTE — ED Triage Notes (Signed)
Patient presents to Urgent Care with complaints of cough and sore throat. Treating with Robitussin. Pt had a negative covid test Weds.   Denies fever.

## 2020-07-29 ENCOUNTER — Other Ambulatory Visit: Payer: Self-pay | Admitting: Family Medicine

## 2020-07-29 DIAGNOSIS — E781 Pure hyperglyceridemia: Secondary | ICD-10-CM

## 2020-09-08 ENCOUNTER — Telehealth: Payer: Self-pay | Admitting: Family Medicine

## 2020-09-08 ENCOUNTER — Telehealth: Payer: Self-pay

## 2020-09-08 DIAGNOSIS — E781 Pure hyperglyceridemia: Secondary | ICD-10-CM

## 2020-09-08 MED ORDER — PRAVASTATIN SODIUM 40 MG PO TABS: 40.0000 mg | ORAL_TABLET | Freq: Every day | ORAL | 0 refills | Status: DC

## 2020-09-08 NOTE — Telephone Encounter (Signed)
[facility] faxed refill request for the following medications:  pravastatin (PRAVACHOL) 40 MG tablet  Last Rx: 07/29/20 Qty: 30 Refills: 0 NOV: 10/13/20 Please advise. Thanks TNP

## 2020-09-08 NOTE — Telephone Encounter (Signed)
Copied from [place] [id number]. Topic: General - Other >> Sep 08, 2020 11:49 AM Tessa Lerner A wrote: Reason for CRM: Patient would like to be prescribed a 90 (ninety) day supply of their pravastatin (PRAVACHOL) 40 MG tablet  Patient has contacted their pharmacy regarding this prescription and been told that they would have to contact their PCP  Patient would like the prescription submitted to  [facility] [place] [facility] [place] [place]  Phone:  [phone] Fax:  [phone]  Please contact to advise further when possible

## 2020-09-14 ENCOUNTER — Other Ambulatory Visit: Payer: Self-pay | Admitting: Family Medicine

## 2020-09-14 ENCOUNTER — Telehealth: Payer: Self-pay

## 2020-09-14 DIAGNOSIS — E781 Pure hyperglyceridemia: Secondary | ICD-10-CM

## 2020-09-14 MED ORDER — PRAVASTATIN SODIUM 40 MG PO TABS: 40.0000 mg | ORAL_TABLET | Freq: Every day | ORAL | 0 refills | Status: DC

## 2020-09-14 NOTE — Telephone Encounter (Signed)
Pt called asking about the pravastatin saying she was going to be out tomorrow.  She needs a 90 day supple sent to [facility] on [address].

## 2020-09-14 NOTE — Telephone Encounter (Signed)
Copied from [place] [id number]. Topic: Quick Communication - Rx Refill/Question >> Sep 14, 2020  3:41 PM Pawlus, Brayton Layman A wrote: Pt called in regarding pravastatin (PRAVACHOL) 40 MG tablet, pt stated her insurance will only cover a 90 day supply, pt completely ran out of this medication.

## 2020-09-14 NOTE — Telephone Encounter (Signed)
Last office visit was 04/08/2019. Pt has an appointment schedule for 10/13/2020.  I sent a 30 day supply to get her though to her appointment.  Her insurance will only cove a 90 day supply.  It is okay to send 90 days?  Thanks,   -Mickel Baas

## 2020-09-14 NOTE — Telephone Encounter (Signed)
Apt scheduled for 10/13/2020.  Will provide enough to get her until her appointment.  RX was sent to [facility] on [address].    Thanks,   -Mickel Baas

## 2020-09-14 NOTE — Addendum Note (Signed)
Addended by: Ashley Royalty E on: 09/14/2020 03:19 PM   Modules accepted: Orders

## 2020-09-14 NOTE — Telephone Encounter (Signed)
  Notes to clinic:  Patient requesting 90 day supply   Requested Prescriptions  Pending Prescriptions Disp Refills   pravastatin (PRAVACHOL) 40 MG tablet [Pharmacy Med Name: PRAVASTATIN '40MG'$  TABLETS] 90 tablet     Sig: TAKE 1 TABLET(40 MG) BY MOUTH DAILY      Cardiovascular:  Antilipid - Statins Failed - 09/14/2020  3:19 PM      Failed - Total Cholesterol in normal range and within 360 days    Cholesterol, Total  Date Value Ref Range Status  04/08/2019 139 100 - 199 mg/dL Final          Failed - LDL in normal range and within 360 days    LDL Chol Calc (NIH)  Date Value Ref Range Status  04/08/2019 52 0 - 99 mg/dL Final          Failed - HDL in normal range and within 360 days    HDL  Date Value Ref Range Status  04/08/2019 53 >39 mg/dL Final          Failed - Triglycerides in normal range and within 360 days    Triglycerides  Date Value Ref Range Status  04/08/2019 211 (H) 0 - 149 mg/dL Final          Failed - Valid encounter within last 12 months    Recent Outpatient Visits           1 year ago Type 2 diabetes mellitus without complication, without long-term current use of insulin ([place])   [facility] Birdie Sons, MD   1 year ago Diabetes mellitus without complication [facility])   [facility] Birdie Sons, MD   2 years ago Type 2 diabetes mellitus without complication, without long-term current use of insulin ([place])   [facility] Birdie Sons, MD   2 years ago Controlled type 2 diabetes mellitus with complication, without long-term current use of insulin [facility])   [facility] Birdie Sons, MD   2 years ago Annual physical exam   [facility] Birdie Sons, MD       Future Appointments             In 4 weeks Caryn Section, Kirstie Peri, MD [facility], [place] - Patient is not pregnant

## 2020-10-13 ENCOUNTER — Ambulatory Visit: Payer: Managed Care, Other (non HMO) | Admitting: Family Medicine

## 2020-10-19 ENCOUNTER — Encounter: Payer: Self-pay | Admitting: Family Medicine

## 2020-10-19 ENCOUNTER — Other Ambulatory Visit: Payer: Self-pay

## 2020-10-19 ENCOUNTER — Ambulatory Visit: Payer: Managed Care, Other (non HMO) | Admitting: Family Medicine

## 2020-10-19 VITALS — BP 128/88 | HR 81 | Temp 97.5°F | Resp 16 | Wt 177.0 lb

## 2020-10-19 DIAGNOSIS — E781 Pure hyperglyceridemia: Secondary | ICD-10-CM | POA: Diagnosis not present

## 2020-10-19 DIAGNOSIS — E119 Type 2 diabetes mellitus without complications: Secondary | ICD-10-CM | POA: Diagnosis not present

## 2020-10-19 LAB — POCT UA - MICROALBUMIN: Microalbumin Ur, POC: 50 mg/L

## 2020-10-19 NOTE — Patient Instructions (Signed)
.   Please review the attached list of medications and notify my office if there are any errors.   . Please bring all of your medications to every appointment so we can make sure that our medication list is the same as yours.   

## 2020-10-19 NOTE — Progress Notes (Addendum)
Established patient visit   Patient: Nancy Wood   DOB: 1970-05-18   50 y.o. Female  MRN: [id number] Visit Date: 10/19/2020  Today's healthcare provider: Lelon Huh, MD   Chief Complaint  Patient presents with   Diabetes   Hyperlipidemia   Subjective  -------------------------------------------------------------------------------------------------------------------- HPI  Diabetes Mellitus Type II, Follow-up  Lab Results  Component Value Date   HGBA1C 7.7 (H) 04/08/2019   HGBA1C 8.1 (H) 12/17/2018   HGBA1C 8.1 (H) 07/24/2018   Wt Readings from Last 3 Encounters:  10/19/20 177 lb (80.3 kg)  07/11/20 175 lb (79.4 kg)  05/13/19 179 lb (81.2 kg)   Last seen for diabetes 1  year  ago.  Management since then includes continuing same medications. She reports good compliance with treatment. She is not having side effects.  Symptoms: No fatigue No foot ulcerations  No appetite changes No nausea  No paresthesia of the feet  No polydipsia  No polyuria No visual disturbances   No vomiting     Home blood sugar records: fasting range: 160's  Episodes of hypoglycemia? No    Current insulin regiment: none Most Recent Eye Exam: not UTD Current exercise: none Current diet habits: in general, an "unhealthy" diet  Pertinent Labs: Lab Results  Component Value Date   CHOL 139 04/08/2019   HDL 53 04/08/2019   LDLCALC 52 04/08/2019   TRIG 211 (H) 04/08/2019   CHOLHDL 2.6 04/08/2019   Lab Results  Component Value Date   NA 138 04/08/2019   K 4.3 04/08/2019   CREATININE 0.77 04/08/2019   GFRNONAA 90 04/08/2019   GFRAA 104 04/08/2019   GLUCOSE 169 (H) 04/08/2019     ---------------------------------------------------------------------------------------------------   Lipid/Cholesterol, Follow-up  Last lipid panel Other pertinent labs  Lab Results  Component Value Date   CHOL 139 04/08/2019   HDL 53 04/08/2019   LDLCALC 52 04/08/2019   TRIG 211 (H)  04/08/2019   CHOLHDL 2.6 04/08/2019   Lab Results  Component Value Date   ALT 29 04/08/2019   AST 20 04/08/2019   PLT 259 04/08/2019   TSH 3.320 12/01/2014     She was last seen for this on 04/08/2019.   Management since that visit includes continuing same medications.  She reports good compliance with treatment. She is not having side effects.   Symptoms: No chest pain No chest pressure/discomfort  No dyspnea No lower extremity edema  No numbness or tingling of extremity No orthopnea  No palpitations No paroxysmal nocturnal dyspnea  No speech difficulty No syncope   Current diet: well balanced Current exercise: none  The 10-year ASCVD risk score Mikey Bussing DC Jr., et al., 2013) is: 5.1%  ---------------------------------------------------------------------------------------------------      Medications: Outpatient Medications Prior to Visit  Medication Sig   CONTOUR NEXT TEST test strip USE TO CHECK BLOOD SUGAR DAILY   metFORMIN (GLUCOPHAGE-XR) 500 MG 24 hr tablet Take 1 tablet every morning, and 2 tablets every evening   Microlet Lancets MISC USE TO CHECK BLOOD SUGAR DAILY   pravastatin (PRAVACHOL) 40 MG tablet Take 1 tablet (40 mg total) by mouth daily.   cyclobenzaprine (FLEXERIL) 5 MG tablet Take 1 tablet (5 mg total) by mouth 3 (three) times daily as needed for muscle spasms. (Patient not taking: Reported on 10/19/2020)   [DISCONTINUED] buPROPion (WELLBUTRIN SR) 150 MG 12 hr tablet 1 tablet daily for 3 days, then 1 tablet twice daily. Stop smoking 14 days after starting medication   [DISCONTINUED]  promethazine-dextromethorphan (PROMETHAZINE-DM) 6.25-15 MG/5ML syrup Take 5 mLs by mouth 3 (three) times daily as needed for cough. (Patient not taking: Reported on 10/19/2020)   No facility-administered medications prior to visit.    Review of Systems  Constitutional:  Negative for appetite change, chills, fatigue and fever.  Respiratory:  Negative for chest tightness and  shortness of breath.   Cardiovascular:  Negative for chest pain and palpitations.  Gastrointestinal:  Negative for abdominal pain, nausea and vomiting.  Neurological:  Negative for dizziness and weakness.      Objective  -------------------------------------------------------------------------------------------------------------------- BP 128/88 (BP Location: Right Arm, Patient Position: Sitting, Cuff Size: Normal)   Pulse 81   Temp (!) 97.5 F (36.4 C) (Temporal)   Resp 16   Wt 177 lb (80.3 kg)   LMP 06/01/2014   BMI 29.91 kg/m     Physical Exam   General appearance:  Well developed, well nourished female, cooperative and in no acute distress Head: Normocephalic, without obvious abnormality, atraumatic Respiratory: Respirations even and unlabored, normal respiratory rate Extremities: All extremities are intact.  Skin: Skin color, texture, turgor normal. No rashes seen  Psych: Appropriate mood and affect. Neurologic: Mental status: Alert, oriented to person, place, and time, thought content appropriate.   Results for orders placed or performed in visit on 10/19/20  POCT UA - Microalbumin  Result Value Ref Range   Microalbumin Ur, POC 50 mg/L    Assessment & Plan  ---------------------------------------------------------------------------------------------------------------------- 1. Type 2 diabetes mellitus without complication, without long-term current use of insulin (HCC) Generally doing well. In process of establishing with another eye doctor. Encourage regular exercise and healthy diet.   - CBC - Comprehensive metabolic panel - Hemoglobin A1c  2. Hypertriglyceridemia She is tolerating pravastatin well with no adverse effects.   - Lipid panel        The entirety of the information documented in the History of Present Illness, Review of Systems and Physical Exam were personally obtained by me. Portions of this information were initially documented by the CMA and  reviewed by me for thoroughness and accuracy.     Lelon Huh, MD  [facility] [phone] (phone) [phone] (fax)  [place]

## 2020-10-20 LAB — LIPID PANEL
Chol/HDL Ratio: 3.1 ratio (ref 0.0–4.4)
Cholesterol, Total: 154 mg/dL (ref 100–199)
HDL: 50 mg/dL (ref >39)
LDL Chol Calc (NIH): 62 mg/dL (ref 0–99)
Triglycerides: 264 mg/dL — ABNORMAL HIGH (ref 0–149)
VLDL Cholesterol Cal: 42 mg/dL — ABNORMAL HIGH (ref 5–40)

## 2020-10-20 LAB — COMPREHENSIVE METABOLIC PANEL
ALT: 32 IU/L (ref 0–32)
AST: 26 IU/L (ref 0–40)
Albumin/Globulin Ratio: 2.2 (ref 1.2–2.2)
Albumin: 5.1 g/dL — ABNORMAL HIGH (ref 3.8–4.9)
Alkaline Phosphatase: 61 IU/L (ref 44–121)
BUN/Creatinine Ratio: 17 (ref 9–23)
BUN: 12 mg/dL (ref 6–24)
Bilirubin Total: 0.3 mg/dL (ref 0.0–1.2)
CO2: 21 mmol/L (ref 20–29)
Calcium: 10.1 mg/dL (ref 8.7–10.2)
Chloride: 103 mmol/L (ref 96–106)
Creatinine, Ser: 0.69 mg/dL (ref 0.57–1.00)
Globulin, Total: 2.3 g/dL (ref 1.5–4.5)
Glucose: 150 mg/dL — ABNORMAL HIGH (ref 65–99)
Potassium: 4.6 mmol/L (ref 3.5–5.2)
Sodium: 139 mmol/L (ref 134–144)
Total Protein: 7.4 g/dL (ref 6.0–8.5)
eGFR: 104 mL/min/{1.73_m2} (ref >59)

## 2020-10-20 LAB — CBC
Hematocrit: 43.1 % (ref 34.0–46.6)
Hemoglobin: 14.3 g/dL (ref 11.1–15.9)
MCH: 27.7 pg (ref 26.6–33.0)
MCHC: 33.2 g/dL (ref 31.5–35.7)
MCV: 83 fL (ref 79–97)
Platelets: 260 10*3/uL (ref 150–450)
RBC: 5.17 x10E6/uL (ref 3.77–5.28)
RDW: 13 % (ref 11.7–15.4)
WBC: 7.2 10*3/uL (ref 3.4–10.8)

## 2020-10-20 LAB — HEMOGLOBIN A1C
Est. average glucose Bld gHb Est-mCnc: 174 mg/dL
Hgb A1c MFr Bld: 7.7 % — ABNORMAL HIGH (ref 4.8–5.6)

## 2020-10-20 MED ORDER — METFORMIN HCL ER 500 MG PO TB24: 1000.0000 mg | ORAL_TABLET | Freq: Two times a day (BID) | ORAL | Status: DC

## 2020-10-20 NOTE — Addendum Note (Signed)
Addended by: Birdie Sons on: 10/20/2020 11:33 AM   Modules accepted: Orders

## 2020-11-09 ENCOUNTER — Other Ambulatory Visit: Payer: Self-pay | Admitting: Family Medicine

## 2020-11-09 DIAGNOSIS — Z1231 Encounter for screening mammogram for malignant neoplasm of breast: Secondary | ICD-10-CM

## 2020-11-11 ENCOUNTER — Other Ambulatory Visit: Payer: Self-pay | Admitting: Family Medicine

## 2020-11-11 DIAGNOSIS — E119 Type 2 diabetes mellitus without complications: Secondary | ICD-10-CM

## 2020-11-11 MED ORDER — METFORMIN HCL ER 500 MG PO TB24: 1000.0000 mg | ORAL_TABLET | Freq: Two times a day (BID) | ORAL | 0 refills | Status: DC

## 2020-11-11 NOTE — Telephone Encounter (Signed)
Medication Refill - Medication: metFORMIN (GLUCOPHAGE-XR) 500 MG 24 hr tablet Must be 90 day supply (2,000 MG daily)  Has the patient contacted their pharmacy? Yes.   (Agent: If no, request that the patient contact the pharmacy for the refill.) (Agent: If yes, when and what did the pharmacy advise?) [facility], [address] AT [address] [place] [address]  Phone: [phone] Fax: [phone]   Preferred Pharmacy (with phone number or street name):  Has the patient been seen for an appointment in the last year OR does the patient have an upcoming appointment? Yes.    Agent: Please be advised that RX refills may take up to 3 business days. We ask that you follow-up with your pharmacy.

## 2020-12-17 ENCOUNTER — Other Ambulatory Visit: Payer: Self-pay | Admitting: Family Medicine

## 2020-12-17 DIAGNOSIS — E781 Pure hyperglyceridemia: Secondary | ICD-10-CM

## 2020-12-17 MED ORDER — PRAVASTATIN SODIUM 40 MG PO TABS: 40.0000 mg | ORAL_TABLET | Freq: Every day | ORAL | 3 refills | Status: DC

## 2020-12-17 NOTE — Telephone Encounter (Signed)
Medication Refill - Medication: Pravastatin   Has the patient contacted their pharmacy? No. PT states that the pharmacy only has 30 day supplies, and that she is needing to have a 90 day supply for insurance to cover medication. Please advise.  (Agent: If no, request that the patient contact the pharmacy for the refill. If patient does not wish to contact the pharmacy document the reason why and proceed with request.) (Agent: If yes, when and what did the pharmacy advise?)  Preferred Pharmacy (with phone number or street name):  [place], [address] AT [facility]  [place] [address]  Phone: [phone] Fax: [phone]  Hours: Not open 24 hours   Has the patient been seen for an appointment in the last year OR does the patient have an upcoming appointment? Yes.    Agent: Please be advised that RX refills may take up to 3 business days. We ask that you follow-up with your pharmacy.

## 2021-01-25 ENCOUNTER — Other Ambulatory Visit: Payer: Self-pay | Admitting: Family Medicine

## 2021-01-25 DIAGNOSIS — E119 Type 2 diabetes mellitus without complications: Secondary | ICD-10-CM

## 2021-02-05 ENCOUNTER — Ambulatory Visit
Admission: RE | Admit: 2021-02-05 | Discharge: 2021-02-05 | Disposition: A | Discharge status: Home / Self Care | Payer: Managed Care, Other (non HMO) | Source: Ambulatory Visit

## 2021-02-05 DIAGNOSIS — Z1231 Encounter for screening mammogram for malignant neoplasm of breast: Secondary | ICD-10-CM

## 2021-02-28 ENCOUNTER — Other Ambulatory Visit: Payer: Self-pay | Admitting: Family Medicine

## 2021-02-28 DIAGNOSIS — E119 Type 2 diabetes mellitus without complications: Secondary | ICD-10-CM

## 2021-05-26 ENCOUNTER — Other Ambulatory Visit: Payer: Self-pay | Admitting: Family Medicine

## 2021-05-26 DIAGNOSIS — E119 Type 2 diabetes mellitus without complications: Secondary | ICD-10-CM

## 2021-06-11 ENCOUNTER — Ambulatory Visit: Payer: Managed Care, Other (non HMO) | Admitting: Family Medicine

## 2021-06-11 ENCOUNTER — Encounter: Payer: Self-pay | Admitting: Family Medicine

## 2021-06-11 DIAGNOSIS — M545 Low back pain, unspecified: Secondary | ICD-10-CM

## 2021-06-11 DIAGNOSIS — F17200 Nicotine dependence, unspecified, uncomplicated: Secondary | ICD-10-CM

## 2021-06-11 DIAGNOSIS — E119 Type 2 diabetes mellitus without complications: Secondary | ICD-10-CM

## 2021-06-11 DIAGNOSIS — Z23 Encounter for immunization: Secondary | ICD-10-CM

## 2021-06-11 MED ORDER — METHOCARBAMOL 750 MG PO TABS: 750.0000 mg | ORAL_TABLET | Freq: Three times a day (TID) | ORAL | 0 refills | Status: DC | PRN

## 2021-06-11 NOTE — Patient Instructions (Signed)
Please review the attached list of medications and notify my office if there are any errors.   Please contact your eyecare professional to schedule a routine eye exam. I recommend seeing Dr. Keith Nice at [phone] or the [facility] at [phone]  

## 2021-06-11 NOTE — Progress Notes (Signed)
?  ? ? ?I,Roshena L Chambers,acting as a scribe for Lelon Huh, MD.,have documented all relevant documentation on the behalf of Lelon Huh, MD,as directed by  Lelon Huh, MD while in the presence of Lelon Huh, MD.  ? ?Established patient visit ? ? ?Patient: Nancy Wood   DOB: [birth year]   51 y.o. Female  MRN: [id number] ?Visit Date: 06/11/2021 ? ?Today's healthcare provider: Lelon Huh, MD  ? ?Chief Complaint  ?Patient presents with  ? Diabetes  ? ?Subjective  ?  ?HPI  ?Diabetes Mellitus Type II, Follow-up ? ?Lab Results  ?Component Value Date  ? HGBA1C 7.7 (H) 10/19/2020  ? HGBA1C 7.7 (H) 04/08/2019  ? HGBA1C 8.1 (H) 12/17/2018  ? ?Wt Readings from Last 3 Encounters:  ?06/11/21 172 lb (78 kg)  ?10/19/20 177 lb (80.3 kg)  ?07/11/20 175 lb (79.4 kg)  ? ?Last seen for diabetes 7 months ago.  ?Management since then includes increasing Metformin to 2 tablets twice a day. ?She reports good compliance with treatment. ?She is not having side effects.  ?Symptoms: ?No fatigue No foot ulcerations  ?No appetite changes No nausea  ?No paresthesia of the feet  No polydipsia  ?No polyuria No visual disturbances   ?No vomiting   ? ? ?Home blood sugar records: fasting range: 120-180 ? ?Episodes of hypoglycemia? No  ?  ?Current insulin regiment: none ?Most Recent Eye Exam: >1 year ago ?Current exercise: none ?Current diet habits: well balanced ? ?Pertinent Labs: ?Lab Results  ?Component Value Date  ? CHOL 154 10/19/2020  ? HDL 50 10/19/2020  ? [place] 62 10/19/2020  ? TRIG 264 (H) 10/19/2020  ? CHOLHDL 3.1 10/19/2020  ? Lab Results  ?Component Value Date  ? NA 139 10/19/2020  ? K 4.6 10/19/2020  ? CREATININE 0.69 10/19/2020  ? EGFR 104 10/19/2020  ? MICROALBUR 50 10/19/2020  ? LABMICR 36.9 12/17/2018  ?  ? ?---------------------------------------------------------------------------------------------------  ?Social History  ? ?Tobacco Use  ? Smoking status: Every Day  ?  Packs/day: 1.00  ?  Types: Cigarettes  ?   Start date: 58  ? Smokeless tobacco: Never  ? Tobacco comments:  ?  Up to 1 ppd since age 59.per patient will try electronic cigarettes.  ?Vaping Use  ? Vaping Use: Never used  ?Substance Use Topics  ? Alcohol use: No  ? Drug use: No  ? ? ? ?Medications: ?Outpatient Medications Prior to Visit  ?Medication Sig  ? CONTOUR NEXT TEST test strip USE TO CHECK BLOOD SUGAR DAILY  ? metFORMIN (GLUCOPHAGE-XR) 500 MG 24 hr tablet TAKE 2 TABLETS(1000 MG) BY MOUTH TWICE DAILY WITH A MEAL. MUST SCHEDULE APPOINTMENT FOR REFILLS  ? Microlet Lancets MISC USE TO CHECK BLOOD SUGAR DAILY  ? pravastatin (PRAVACHOL) 40 MG tablet Take 1 tablet (40 mg total) by mouth daily.  ? cyclobenzaprine (FLEXERIL) 5 MG tablet Take 1 tablet (5 mg total) by mouth 3 (three) times daily as needed for muscle spasms. (Patient not taking: Reported on 06/11/2021)  ? ?No facility-administered medications prior to visit.  ? ? ?Review of Systems  ?Constitutional:  Negative for appetite change, chills, fatigue and fever.  ?Respiratory:  Negative for chest tightness and shortness of breath.   ?Cardiovascular:  Negative for chest pain and palpitations.  ?Gastrointestinal:  Negative for abdominal pain, nausea and vomiting.  ?Neurological:  Negative for dizziness and weakness.  ? ? ?  Objective  ?  ?BP (!) 118/97 (BP Location: Left Arm, Patient Position: Sitting, Cuff Size: Normal)  Pulse 94   Temp 99 ?F (37.2 ?C) (Oral)   Resp 14   Ht 5' 6"  (1.676 m)   Wt 172 lb (78 kg)   LMP 06/01/2014   SpO2 100% Comment: room air  BMI 27.76 kg/m?  ? ?Today's Vitals  ? 06/11/21 1534 06/11/21 1542  ?BP: (!) 119/91 (!) 118/97  ?Pulse: 94   ?Resp: 14   ?Temp: 99 ?F (37.2 ?C)   ?TempSrc: Oral   ?SpO2: 100%   ?Weight: 172 lb (78 kg)   ?Height: 5' 6"  (1.676 m)   ? ?Body mass index is 27.76 kg/m?Marland Kitchen  ?Physical Exam  ? ?General appearance:  ?Well developed, well nourished female, cooperative and in no acute distress ?Head: Normocephalic, without obvious abnormality,  atraumatic ?Respiratory: Respirations even and unlabored, normal respiratory rate ?Extremities: All extremities are intact.  ?Skin: Skin color, texture, turgor normal. No rashes seen  ?Psych: Appropriate mood and affect. ?Neurologic: Mental status: Alert, oriented to person, place, and time, thought content appropriate.  ? ? ? Assessment & Plan  ?  ? ?1. Diabetes mellitus without complication ([place]) ? ?- Urine Albumin-Creatinine with uACR ?- Hemoglobin A1c ? ?2. Compulsive tobacco user syndrome ?Been smoking about 1 ppd since she was 57 ?- Ambulatory Referral Lung Cancer Screening [facility] ? ?3. Need for shingles vaccine ? ?- Administer Zoster, Recombinant (Shingrix) Vaccine #1 ? ?4. Acute low back pain without sciatica, unspecified back pain laterality ?Flares from time to time, but didn't feel cyclobenzaprine was very effective.  ?Try  methocarbamol (ROBAXIN-750) 750 MG tablet; Take 1-2 tablets (750-1,500 mg total) by mouth every 8 (eight) hours as needed for muscle spasms.  Dispense: 30 tablet; Refill: 0 every 8 (eight) hours as needed for muscle spasms.  Dispense: 30 tablet; Refill: 0  ?   ? ?The entirety of the information documented in the History of Present Illness, Review of Systems and Physical Exam were personally obtained by me. Portions of this information were initially documented by the CMA and reviewed by me for thoroughness and accuracy.   ? ? ?Lelon Huh, MD  ?[facility] ?[phone] (phone) ?[phone] (fax) ? ?[facility]  ?

## 2021-06-12 LAB — HEMOGLOBIN A1C
Est. average glucose Bld gHb Est-mCnc: 206 mg/dL
Hgb A1c MFr Bld: 8.8 % — ABNORMAL HIGH (ref 4.8–5.6)

## 2021-06-13 LAB — MICROALBUMIN / CREATININE URINE RATIO
Creatinine, Urine: 77.7 mg/dL
Microalb/Creat Ratio: 36 mg/g creat — ABNORMAL HIGH (ref 0–29)
Microalbumin, Urine: 28.1 ug/mL

## 2021-06-14 ENCOUNTER — Other Ambulatory Visit: Payer: Self-pay | Admitting: Family Medicine

## 2021-07-23 ENCOUNTER — Other Ambulatory Visit: Payer: Self-pay

## 2021-07-23 DIAGNOSIS — Z87891 Personal history of nicotine dependence: Secondary | ICD-10-CM

## 2021-07-23 DIAGNOSIS — Z122 Encounter for screening for malignant neoplasm of respiratory organs: Secondary | ICD-10-CM

## 2021-07-23 DIAGNOSIS — F17210 Nicotine dependence, cigarettes, uncomplicated: Secondary | ICD-10-CM

## 2021-08-19 ENCOUNTER — Encounter: Payer: Self-pay | Admitting: Family Medicine

## 2021-08-27 ENCOUNTER — Other Ambulatory Visit: Payer: Self-pay | Admitting: Family Medicine

## 2021-08-27 DIAGNOSIS — E119 Type 2 diabetes mellitus without complications: Secondary | ICD-10-CM

## 2021-08-27 NOTE — Telephone Encounter (Signed)
Requested Prescriptions  Pending Prescriptions Disp Refills  . metFORMIN (GLUCOPHAGE-XR) 500 MG 24 hr tablet [Pharmacy Med Name: METFORMIN ER 500MG 24HR TABS] 360 tablet 0    Sig: TAKE 2 TABLETS(1000 MG) BY MOUTH TWICE DAILY WITH A MEAL. MUST MAKE APPOINTMENT     Endocrinology:  Diabetes - Biguanides Failed - 08/27/2021 12:58 PM      Failed - HBA1C is between 0 and 7.9 and within 180 days    Hgb A1c MFr Bld  Date Value Ref Range Status  06/11/2021 8.8 (H) 4.8 - 5.6 % Final    Comment:             Prediabetes: 5.7 - 6.4          Diabetes: >6.4          Glycemic control for adults with diabetes: <7.0          Failed - B12 Level in normal range and within 720 days    No results found for: "VITAMINB12"       Failed - CBC within normal limits and completed in the last 12 months    WBC  Date Value Ref Range Status  10/19/2020 7.2 3.4 - 10.8 x10E3/uL Final  01/15/2014 8.1 4.0 - 10.5 K/uL Final   RBC  Date Value Ref Range Status  10/19/2020 5.17 3.77 - 5.28 x10E6/uL Final  01/15/2014 3.91 3.87 - 5.11 MIL/uL Final   Hemoglobin  Date Value Ref Range Status  10/19/2020 14.3 11.1 - 15.9 g/dL Final   Hematocrit  Date Value Ref Range Status  10/19/2020 43.1 34.0 - 46.6 % Final   MCHC  Date Value Ref Range Status  10/19/2020 33.2 31.5 - 35.7 g/dL Final  01/15/2014 32.5 30.0 - 36.0 g/dL Final   [facility]  Date Value Ref Range Status  10/19/2020 27.7 26.6 - 33.0 pg Final  01/15/2014 27.9 26.0 - 34.0 pg Final   MCV  Date Value Ref Range Status  10/19/2020 83 79 - 97 fL Final   No results found for: "PLTCOUNTKUC", "LABPLAT", "POCPLA" RDW  Date Value Ref Range Status  10/19/2020 13.0 11.7 - 15.4 % Final         Passed - Cr in normal range and within 360 days    Creatinine, Ser  Date Value Ref Range Status  10/19/2020 0.69 0.57 - 1.00 mg/dL Final         Passed - eGFR in normal range and within 360 days    GFR calc Af Amer  Date Value Ref Range Status  04/08/2019 104 >59  mL/min/1.73 Final   GFR calc non Af Amer  Date Value Ref Range Status  04/08/2019 90 >59 mL/min/1.73 Final   eGFR  Date Value Ref Range Status  10/19/2020 104 >59 mL/min/1.73 Final         Passed - Valid encounter within last 6 months    Recent Outpatient Visits          2 months ago Diabetes mellitus without complication [facility])   [facility] Birdie Sons, MD   10 months ago Type 2 diabetes mellitus without complication, without long-term current use of insulin ([place])   [facility] Birdie Sons, MD   2 years ago Type 2 diabetes mellitus without complication, without long-term current use of insulin [facility])   [facility] Birdie Sons, MD   2 years ago Diabetes mellitus without complication [facility])   [facility] Birdie Sons, MD  3 years ago Type 2 diabetes mellitus without complication, without long-term current use of insulin [facility])   [facility] Caryn Section, Kirstie Peri, MD

## 2021-08-31 ENCOUNTER — Ambulatory Visit (INDEPENDENT_AMBULATORY_CARE_PROVIDER_SITE_OTHER): Payer: Managed Care, Other (non HMO) | Admitting: Acute Care

## 2021-08-31 ENCOUNTER — Encounter: Payer: Self-pay | Admitting: Acute Care

## 2021-08-31 DIAGNOSIS — F17210 Nicotine dependence, cigarettes, uncomplicated: Secondary | ICD-10-CM | POA: Diagnosis not present

## 2021-08-31 NOTE — Patient Instructions (Signed)

## 2021-08-31 NOTE — Progress Notes (Signed)
Virtual Visit via Telephone Note  I connected with Nancy Wood on 08/31/21 at  4:00 PM EDT by telephone and verified that I am speaking with the correct person using two identifiers.  Location: Patient: At home Provider:  [place], [place], [place], [address]    I discussed the limitations, risks, security and privacy concerns of performing an evaluation and management service by telephone and the availability of in person appointments. I also discussed with the patient that there may be a patient responsible charge related to this service. The patient expressed understanding and agreed to proceed.    Shared Decision Making Visit Lung Cancer Screening Program [phone])   Eligibility: Age 51 y.o. Pack Years Smoking History Calculation 42.5 pack years  (# packs/per year x # years smoked) Recent History of coughing up blood  no Unexplained weight loss? no ( >Than 15 pounds within the last 6 months ) Prior History Lung / other cancer no (Diagnosis within the last 5 years already requiring surveillance chest CT Scans). Smoking Status Current Smoker Former Smokers: Years since quit:  NA  Quit Date:  NA  Visit Components: Discussion included one or more decision making aids. yes Discussion included risk/benefits of screening. yes Discussion included potential follow up diagnostic testing for abnormal scans. yes Discussion included meaning and risk of over diagnosis. yes Discussion included meaning and risk of False Positives. yes Discussion included meaning of total radiation exposure. yes  Counseling Included: Importance of adherence to annual lung cancer LDCT screening. yes Impact of comorbidities on ability to participate in the program. yes Ability and willingness to under diagnostic treatment. yes  Smoking Cessation Counseling: Current Smokers:  Discussed importance of smoking cessation. yes Information about tobacco cessation classes and interventions  provided to patient. yes Patient provided with "ticket" for LDCT Scan. yes Symptomatic Patient. no  Counseling NA Diagnosis Code: Tobacco Use Z72.0 Asymptomatic Patient yes  Counseling (Intermediate counseling: > three minutes counseling) [id number] Former Smokers:  Discussed the importance of maintaining cigarette abstinence. yes Diagnosis Code: Personal History of Nicotine Dependence. P38.250 Information about tobacco cessation classes and interventions provided to patient. Yes Patient provided with "ticket" for LDCT Scan. yes Written Order for Lung Cancer Screening with LDCT placed in Epic. Yes (CT Chest Lung Cancer Screening Low Dose W/O CM) [id number] Z12.2-Screening of respiratory organs Z87.891-Personal history of nicotine dependence  I have spent 25 minutes of face to face/ virtual visit   time with  Nancy Wood discussing the risks and benefits of lung cancer screening. We viewed / discussed a power point together that explained in detail the above noted topics. We paused at intervals to allow for questions to be asked and answered to ensure understanding.We discussed that the single most powerful action that she can take to decrease her risk of developing lung cancer is to quit smoking. We discussed whether or not she is ready to commit to setting a quit date. We discussed options for tools to aid in quitting smoking including nicotine replacement therapy, non-nicotine medications, support groups, Quit Smart classes, and behavior modification. We discussed that often times setting smaller, more achievable goals, such as eliminating 1 cigarette a day for a week and then 2 cigarettes a day for a week can be helpful in slowly decreasing the number of cigarettes smoked. This allows for a sense of accomplishment as well as providing a clinical benefit. I provided  her  with smoking cessation  information  with contact information for community resources, classes,  free nicotine replacement therapy,  and access to mobile apps, text messaging, and on-line smoking cessation help. I have also provided  her  the office contact information in the event she needs to contact me, or the screening staff. We discussed the time and location of the scan, and that either Doroteo Glassman RN, Joella Prince, RN  or I will call / send a letter with the results within 24-72 hours of receiving them. The patient verbalized understanding of all of  the above and had no further questions upon leaving the office. They have my contact information in the event they have any further questions.  I spent 3 minutes counseling on smoking cessation and the health risks of continued tobacco abuse.  I explained to the patient that there has been a high incidence of coronary artery disease noted on these exams. I explained that this is a non-gated exam therefore degree or severity cannot be determined. This patient is on statin therapy. I have asked the patient to follow-up with their PCP regarding any incidental finding of coronary artery disease and management with diet or medication as their PCP  feels is clinically indicated. The patient verbalized understanding of the above and had no further questions upon completion of the visit.      Magdalen Spatz, NP 08/31/2021

## 2021-09-01 ENCOUNTER — Inpatient Hospital Stay: Admission: RE | Admit: 2021-09-01 | Payer: Managed Care, Other (non HMO) | Source: Ambulatory Visit

## 2021-09-14 ENCOUNTER — Ambulatory Visit
Admission: RE | Admit: 2021-09-14 | Discharge: 2021-09-14 | Disposition: A | Discharge status: Home / Self Care | Payer: Managed Care, Other (non HMO) | Source: Ambulatory Visit | Attending: Acute Care | Admitting: Acute Care

## 2021-09-14 DIAGNOSIS — F17210 Nicotine dependence, cigarettes, uncomplicated: Secondary | ICD-10-CM

## 2021-09-14 DIAGNOSIS — Z122 Encounter for screening for malignant neoplasm of respiratory organs: Secondary | ICD-10-CM

## 2021-09-14 DIAGNOSIS — Z87891 Personal history of nicotine dependence: Secondary | ICD-10-CM

## 2021-09-16 ENCOUNTER — Other Ambulatory Visit: Payer: Self-pay

## 2021-09-16 DIAGNOSIS — Z122 Encounter for screening for malignant neoplasm of respiratory organs: Secondary | ICD-10-CM

## 2021-09-16 DIAGNOSIS — F17210 Nicotine dependence, cigarettes, uncomplicated: Secondary | ICD-10-CM

## 2021-09-16 DIAGNOSIS — Z87891 Personal history of nicotine dependence: Secondary | ICD-10-CM

## 2021-09-20 ENCOUNTER — Telehealth: Payer: Self-pay | Admitting: Acute Care

## 2021-09-20 NOTE — Telephone Encounter (Signed)
Spoke with pt and reviewed recent lung screening CT results. I advised pt that there wasn't anything seen concerning from a lung cancer  perspective. One small nodule noted that will be followed up on at next scan in 12 months. I advised pt that there was an area that showed some possible infection/ inflammation. Pt did report having a bad chest cold at the ned of June 2023 and she had to take antibiotic and steroid. Pt states that she is feeling better and doesn't really have any lingering symptoms at this time. I offered to schedule her to see on of our pulmonologist but she doesn't feel like she needs to at this time. I advised pt that she can always call us back if she changes her mind. Pt verbalized understanding and had no further questions. CT results faxed to PCP. Order was placed for 12 mth f/u lung screening CT.

## 2021-10-07 NOTE — Progress Notes (Unsigned)
I,Roshena L Chambers,acting as a scribe for Lelon Huh, MD.,have documented all relevant documentation on the behalf of Lelon Huh, MD,as directed by  Lelon Huh, MD while in the presence of Lelon Huh, MD.   Established patient visit   Patient: Nancy Wood   DOB: February 28, 1970   51 y.o. Female  MRN: [id number] Visit Date: 10/08/2021  Today's healthcare provider: Lelon Huh, MD   Chief Complaint  Patient presents with   Diabetes   Subjective    HPI  Diabetes Mellitus Type II, Follow-up  Lab Results  Component Value Date   HGBA1C 7.9 (A) 10/08/2021   HGBA1C 8.8 (H) 06/11/2021   HGBA1C 7.7 (H) 10/19/2020   Wt Readings from Last 3 Encounters:  10/08/21 168 lb (76.2 kg)  06/11/21 172 lb (78 kg)  10/19/20 177 lb (80.3 kg)   Last seen for diabetes 3 months ago.  At that visit, labs were checked showing an elevated A1C of 8.8. Patient was notified via mychart of the results and recommendation of adding another medication such as Ozempic, Trulicity, or Rybelsus. Patient read the message, but did not respond to which she preferred.    She reports poor compliance with treatment. She is not having side effects. Patient was taking a steroid 07/2021. She states her blood sugars were as high as 450 after taking the steroid.  Symptoms: No fatigue No foot ulcerations  No appetite changes No nausea  No paresthesia of the feet  No polydipsia  No polyuria No visual disturbances   No vomiting     Home blood sugar records:  120-160 after eating  Episodes of hypoglycemia? No    Current insulin regiment: none Most Recent Eye Exam: not UTD Current exercise: walking Current diet habits: well balanced  Pertinent Labs: Lab Results  Component Value Date   CHOL 154 10/19/2020   HDL 50 10/19/2020   LDLCALC 62 10/19/2020   TRIG 264 (H) 10/19/2020   CHOLHDL 3.1 10/19/2020   Lab Results  Component Value Date   NA 139 10/19/2020   K 4.6 10/19/2020   CREATININE  0.69 10/19/2020   EGFR 104 10/19/2020   MICROALBUR 50 10/19/2020   LABMICR 28.1 06/11/2021     ---------------------------------------------------------------------------------------------------  She also reports recurrent pain left lower back for last few years. No improvement with muscle relaxers. Not severe but she is concerned about her kidneys.    Medications: Outpatient Medications Prior to Visit  Medication Sig   CONTOUR NEXT TEST test strip USE TO CHECK BLOOD SUGAR DAILY   metFORMIN (GLUCOPHAGE-XR) 500 MG 24 hr tablet TAKE 2 TABLETS(1000 MG) BY MOUTH TWICE DAILY WITH A MEAL. MUST MAKE APPOINTMENT   methocarbamol (ROBAXIN-750) 750 MG tablet Take 1-2 tablets (750-1,500 mg total) by mouth every 8 (eight) hours as needed for muscle spasms.   Microlet Lancets MISC USE TO CHECK BLOOD SUGAR DAILY   pravastatin (PRAVACHOL) 40 MG tablet Take 1 tablet (40 mg total) by mouth daily.   No facility-administered medications prior to visit.    Review of Systems  Constitutional:  Negative for appetite change, chills, fatigue and fever.  Respiratory:  Negative for chest tightness and shortness of breath.   Cardiovascular:  Negative for chest pain and palpitations.  Gastrointestinal:  Negative for abdominal pain, nausea and vomiting.  Neurological:  Negative for dizziness and weakness.    {Labs  Heme  Chem  Endocrine  Serology  Results Review (optional):23779}   Objective    BP 113/86 (BP  Location: Left Arm, Patient Position: Sitting, Cuff Size: Large)   Pulse 83   Temp 98.2 F (36.8 C) (Oral)   Resp 14   Wt 168 lb (76.2 kg)   LMP 06/01/2014   SpO2 100% Comment: room air  BMI 27.12 kg/m  {Show previous vital signs (optional):23777}  Physical Exam   General: Appearance:    Well developed, well nourished female in no acute distress  Eyes:    PERRL, conjunctiva/corneas clear, EOM's intact       Lungs:     Clear to auscultation bilaterally, respirations unlabored  Heart:     Normal heart rate. Normal rhythm. No murmurs, rubs, or gallops.    MS:   All extremities are intact.  No CVAT  Neurologic:   Awake, alert, oriented x 3. No apparent focal neurological defect.         Results for orders placed or performed in visit on 10/08/21  POCT HgB A1C  Result Value Ref Range   Hemoglobin A1C 7.9 (A) 4.0 - 5.6 %   Est. average glucose Bld gHb Est-mCnc 180   POCT Urinalysis Dipstick  Result Value Ref Range   Color, UA yellow    Clarity, UA clear    Glucose, UA Positive (A) Negative   Bilirubin, UA negative    Ketones, UA negative    Spec Grav, UA 1.020 1.010 - 1.025   Blood, UA negative    pH, UA 6.0 5.0 - 8.0   Protein, UA Negative Negative   Urobilinogen, UA 0.2 0.2 or 1.0 E.U./dL   Nitrite, UA negative    Leukocytes, UA Negative Negative   Appearance     Odor      Assessment & Plan     1. Diabetes mellitus without complication ([place]) Doing much better with diet. A1c improved, but not at goal. She prefers not to add more medications and feels like she can continue to lose weight and get sugar under better control with diet.   Continue metformin and reassess in 3-4 months.   She is due for labs including lipids, metabolic panel and CBC. She is having blood drawn for [facility] through her employer this afternoon and states she will send copy here for her records.  2. Flank pain This is likely musculoskeletal, as u/a is normal and location of pain is lower than would be expected for pain of renal origin.   3. Need for shingles vaccine  - Administer Zoster, Recombinant (Shingrix) Vaccine  #2     {provider attestation***:1}   Lelon Huh, MD  [facility] [phone] (phone) [phone] (fax)  [place]

## 2021-10-08 ENCOUNTER — Ambulatory Visit: Payer: Managed Care, Other (non HMO) | Admitting: Family Medicine

## 2021-10-08 ENCOUNTER — Encounter: Payer: Self-pay | Admitting: Family Medicine

## 2021-10-08 VITALS — BP 113/86 | HR 83 | Temp 98.2°F | Resp 14 | Wt 168.0 lb

## 2021-10-08 DIAGNOSIS — E119 Type 2 diabetes mellitus without complications: Secondary | ICD-10-CM | POA: Diagnosis not present

## 2021-10-08 DIAGNOSIS — R109 Unspecified abdominal pain: Secondary | ICD-10-CM | POA: Diagnosis not present

## 2021-10-08 DIAGNOSIS — Z23 Encounter for immunization: Secondary | ICD-10-CM

## 2021-10-08 DIAGNOSIS — Z1159 Encounter for screening for other viral diseases: Secondary | ICD-10-CM | POA: Diagnosis not present

## 2021-10-08 LAB — POCT URINALYSIS DIPSTICK
Bilirubin, UA: NEGATIVE
Blood, UA: NEGATIVE
Glucose, UA: POSITIVE — AB
Ketones, UA: NEGATIVE
Leukocytes, UA: NEGATIVE
Nitrite, UA: NEGATIVE
Protein, UA: NEGATIVE
Spec Grav, UA: 1.02 (ref 1.010–1.025)
Urobilinogen, UA: 0.2 E.U./dL
pH, UA: 6 (ref 5.0–8.0)

## 2021-10-08 LAB — POCT GLYCOSYLATED HEMOGLOBIN (HGB A1C)
Est. average glucose Bld gHb Est-mCnc: 180
Hemoglobin A1C: 7.9 % — AB (ref 4.0–5.6)

## 2021-10-08 NOTE — Patient Instructions (Signed)
.   Please review the attached list of medications and notify my office if there are any errors.   . Please bring all of your medications to every appointment so we can make sure that our medication list is the same as yours.   . Please contact your eyecare professional to schedule a routine eye exam    

## 2021-11-03 ENCOUNTER — Other Ambulatory Visit: Payer: Self-pay | Admitting: Family Medicine

## 2021-11-03 DIAGNOSIS — E119 Type 2 diabetes mellitus without complications: Secondary | ICD-10-CM

## 2021-12-20 ENCOUNTER — Other Ambulatory Visit: Payer: Self-pay | Admitting: Family Medicine

## 2021-12-20 DIAGNOSIS — E781 Pure hyperglyceridemia: Secondary | ICD-10-CM

## 2021-12-23 ENCOUNTER — Other Ambulatory Visit: Payer: Self-pay | Admitting: Family Medicine

## 2021-12-23 DIAGNOSIS — Z1231 Encounter for screening mammogram for malignant neoplasm of breast: Secondary | ICD-10-CM

## 2022-02-08 ENCOUNTER — Other Ambulatory Visit: Payer: Self-pay | Admitting: Family Medicine

## 2022-02-08 DIAGNOSIS — Z1231 Encounter for screening mammogram for malignant neoplasm of breast: Secondary | ICD-10-CM

## 2022-02-09 ENCOUNTER — Ambulatory Visit: Payer: Managed Care, Other (non HMO) | Admitting: Family Medicine

## 2022-03-16 ENCOUNTER — Ambulatory Visit: Payer: Managed Care, Other (non HMO) | Admitting: Family Medicine

## 2022-03-16 NOTE — Progress Notes (Deleted)
    Established patient visit   Patient: Nancy Wood   DOB: October 07, 1970   52 y.o. Female  MRN: [id number] Visit Date: 03/16/2022  Today's healthcare provider: Lelon Huh, MD   No chief complaint on file.  Subjective    HPI  Diabetes Mellitus Type II, Follow-up  Lab Results  Component Value Date   HGBA1C 7.9 (A) 10/08/2021   HGBA1C 8.8 (H) 06/11/2021   HGBA1C 7.7 (H) 10/19/2020   Wt Readings from Last 3 Encounters:  10/08/21 168 lb (76.2 kg)  06/11/21 172 lb (78 kg)  10/19/20 177 lb (80.3 kg)   Last seen for diabetes 5 months ago.  Management since then includes no changes continue Metformin. She reports {excellent/good/fair/poor:19665} compliance with treatment. She {is/is not:21021397} having side effects. {document side effects if present:1}  Home blood sugar records: {diabetes glucometry results:16657}  Episodes of hypoglycemia? {Yes/No:20286} {enter symptoms and frequency of symptoms if yes:1}   Current insulin regiment: none Most Recent Eye Exam: ***  Pertinent Labs: Lab Results  Component Value Date   CHOL 154 10/19/2020   HDL 50 10/19/2020   LDLCALC 62 10/19/2020   TRIG 264 (H) 10/19/2020   CHOLHDL 3.1 10/19/2020   Lab Results  Component Value Date   NA 139 10/19/2020   K 4.6 10/19/2020   CREATININE 0.69 10/19/2020   EGFR 104 10/19/2020   LABMICR 28.1 06/11/2021   MICRALBCREAT 36 (H) 06/11/2021     ---------------------------------------------------------------------------------------------------   Medications: Outpatient Medications Prior to Visit  Medication Sig   CONTOUR NEXT TEST test strip USE TO CHECK BLOOD SUGAR DAILY   metFORMIN (GLUCOPHAGE-XR) 500 MG 24 hr tablet TAKE 2 TABLETS(1000 MG) BY MOUTH TWICE DAILY WITH A MEAL   Microlet Lancets MISC USE TO CHECK BLOOD SUGAR DAILY   pravastatin (PRAVACHOL) 40 MG tablet TAKE 1 TABLET(40 MG) BY MOUTH DAILY   No facility-administered medications prior to visit.    Review of Systems   Constitutional:  Negative for appetite change, chills, fatigue, fever and unexpected weight change.  Eyes:  Negative for visual disturbance.  Respiratory:  Negative for chest tightness and shortness of breath.   Cardiovascular:  Negative for chest pain and leg swelling.  Gastrointestinal:  Negative for abdominal pain, diarrhea, nausea and vomiting.    {Labs  Heme  Chem  Endocrine  Serology  Results Review (optional):23779}   Objective    LMP 06/01/2014  BP Readings from Last 3 Encounters:  10/08/21 113/86  06/11/21 (!) 118/97  10/19/20 128/88   Wt Readings from Last 3 Encounters:  10/08/21 168 lb (76.2 kg)  06/11/21 172 lb (78 kg)  10/19/20 177 lb (80.3 kg)      Physical Exam  ***  No results found for any visits on 03/16/22.  Assessment & Plan     ***  No follow-ups on file.      {provider attestation***:1}   Lelon Huh, MD  [place] [phone] (phone) [phone] (fax)  [place]

## 2022-04-01 ENCOUNTER — Ambulatory Visit
Admission: RE | Admit: 2022-04-01 | Discharge: 2022-04-01 | Disposition: A | Discharge status: Home / Self Care | Payer: Managed Care, Other (non HMO) | Source: Ambulatory Visit

## 2022-04-01 DIAGNOSIS — Z1231 Encounter for screening mammogram for malignant neoplasm of breast: Secondary | ICD-10-CM

## 2022-04-12 NOTE — Progress Notes (Unsigned)
I,Sulibeya S Dimas,acting as a scribe for Lelon Huh, MD.,have documented all relevant documentation on the behalf of Lelon Huh, MD,as directed by  Lelon Huh, MD while in the presence of Lelon Huh, MD.     Established patient visit   Patient: Nancy Wood   DOB: [birth year]   52 y.o. Female  MRN: [id number] Visit Date: 04/13/2022  Today's healthcare provider: Lelon Huh, MD   No chief complaint on file.  Subjective    HPI  Diabetes Mellitus Type II, Follow-up  Lab Results  Component Value Date   HGBA1C 7.9 (A) 10/08/2021   HGBA1C 8.8 (H) 06/11/2021   HGBA1C 7.7 (H) 10/19/2020   Wt Readings from Last 3 Encounters:  10/08/21 168 lb (76.2 kg)  06/11/21 172 lb (78 kg)  10/19/20 177 lb (80.3 kg)   Last seen for diabetes 6 months ago.  Management since then includes continue metformin. She reports {excellent/good/fair/poor:19665} compliance with treatment. She {is/is not:21021397} having side effects. {document side effects if present:1} Symptoms: {Yes/No:20286} fatigue {Yes/No:20286} foot ulcerations  {Yes/No:20286} appetite changes {Yes/No:20286} nausea  {Yes/No:20286} paresthesia of the feet  {Yes/No:20286} polydipsia  {Yes/No:20286} polyuria {Yes/No:20286} visual disturbances   {Yes/No:20286} vomiting     Home blood sugar records: {diabetes glucometry results:16657}  Episodes of hypoglycemia? {Yes/No:20286} {enter symptoms and frequency of symptoms if yes:1}   Current insulin regiment: {enter 'none' or type of insulin and number of units taken with each dose of each insulin formulation that the patient is taking:1} Most Recent Eye Exam: *** {Current exercise:16438:::1} {Current diet habits:16563:::1}  Pertinent Labs: Lab Results  Component Value Date   CHOL 154 10/19/2020   HDL 50 10/19/2020   LDLCALC 62 10/19/2020   TRIG 264 (H) 10/19/2020   CHOLHDL 3.1 10/19/2020   Lab Results  Component Value Date   NA 139 10/19/2020   K 4.6  10/19/2020   CREATININE 0.69 10/19/2020   EGFR 104 10/19/2020   LABMICR 28.1 06/11/2021   MICRALBCREAT 36 (H) 06/11/2021     --------------------------------------------------------------------------------------------------- Lipid/Cholesterol, Follow-up  Last lipid panel Other pertinent labs  Lab Results  Component Value Date   CHOL 154 10/19/2020   HDL 50 10/19/2020   LDLCALC 62 10/19/2020   TRIG 264 (H) 10/19/2020   CHOLHDL 3.1 10/19/2020   Lab Results  Component Value Date   ALT 32 10/19/2020   AST 26 10/19/2020   PLT 260 10/19/2020   TSH 3.320 12/01/2014     She was last seen for this 6 months ago.  Management since that visit includes no changes continue pravastatin 40 mg daily.  She reports {excellent/good/fair/poor:19665} compliance with treatment. She {is/is not:9024} having side effects. {document side effects if present:1}   The 10-year ASCVD risk score (Arnett DK, et al., 2019) is: 5.2%  ---------------------------------------------------------------------------------------------------   Medications: Outpatient Medications Prior to Visit  Medication Sig   CONTOUR NEXT TEST test strip USE TO CHECK BLOOD SUGAR DAILY   metFORMIN (GLUCOPHAGE-XR) 500 MG 24 hr tablet TAKE 2 TABLETS(1000 MG) BY MOUTH TWICE DAILY WITH A MEAL   Microlet Lancets MISC USE TO CHECK BLOOD SUGAR DAILY   pravastatin (PRAVACHOL) 40 MG tablet TAKE 1 TABLET(40 MG) BY MOUTH DAILY   No facility-administered medications prior to visit.    Review of Systems  {Labs  Heme  Chem  Endocrine  Serology  Results Review (optional):23779}   Objective    LMP 06/01/2014  {Show previous vital signs (optional):23777}  Physical Exam  ***  No results found for any visits  on 04/13/22.  Assessment & Plan     ***  No follow-ups on file.      {provider attestation***:1}   Lelon Huh, MD  [place] [phone] (phone) [phone] (fax)  [place]

## 2022-04-13 ENCOUNTER — Ambulatory Visit (INDEPENDENT_AMBULATORY_CARE_PROVIDER_SITE_OTHER): Payer: Managed Care, Other (non HMO) | Admitting: Family Medicine

## 2022-04-13 ENCOUNTER — Encounter: Payer: Self-pay | Admitting: Family Medicine

## 2022-04-13 VITALS — BP 115/89 | HR 84 | Temp 97.9°F | Resp 16 | Ht 65.0 in | Wt 171.3 lb

## 2022-04-13 DIAGNOSIS — E119 Type 2 diabetes mellitus without complications: Secondary | ICD-10-CM | POA: Diagnosis not present

## 2022-04-13 DIAGNOSIS — E781 Pure hyperglyceridemia: Secondary | ICD-10-CM | POA: Diagnosis not present

## 2022-04-13 LAB — POCT GLYCOSYLATED HEMOGLOBIN (HGB A1C)
Est. average glucose Bld gHb Est-mCnc: 174
Hemoglobin A1C: 7.7 % — AB (ref 4.0–5.6)

## 2022-04-13 NOTE — Patient Instructions (Signed)
Please review the attached list of medications and notify my office if there are any errors.   Please contact your eyecare professional to schedule a routine eye exam. I recommend seeing Dr. Jomarie Longs at [phone] or the [facility] at [phone]

## 2022-04-14 LAB — COMPREHENSIVE METABOLIC PANEL
ALT: 23 IU/L (ref 0–32)
AST: 17 IU/L (ref 0–40)
Albumin/Globulin Ratio: 1.9 (ref 1.2–2.2)
Albumin: 4.9 g/dL (ref 3.8–4.9)
Alkaline Phosphatase: 62 IU/L (ref 44–121)
BUN/Creatinine Ratio: 14 (ref 9–23)
BUN: 12 mg/dL (ref 6–24)
Bilirubin Total: 0.3 mg/dL (ref 0.0–1.2)
CO2: 20 mmol/L (ref 20–29)
Calcium: 10.2 mg/dL (ref 8.7–10.2)
Chloride: 102 mmol/L (ref 96–106)
Creatinine, Ser: 0.83 mg/dL (ref 0.57–1.00)
Globulin, Total: 2.6 g/dL (ref 1.5–4.5)
Glucose: 155 mg/dL — ABNORMAL HIGH (ref 70–99)
Potassium: 5 mmol/L (ref 3.5–5.2)
Sodium: 139 mmol/L (ref 134–144)
Total Protein: 7.5 g/dL (ref 6.0–8.5)
eGFR: 84 mL/min/{1.73_m2} (ref >59)

## 2022-04-14 LAB — CBC
Hematocrit: 42.4 % (ref 34.0–46.6)
Hemoglobin: 14.3 g/dL (ref 11.1–15.9)
MCH: 28.1 pg (ref 26.6–33.0)
MCHC: 33.7 g/dL (ref 31.5–35.7)
MCV: 84 fL (ref 79–97)
Platelets: 254 10*3/uL (ref 150–450)
RBC: 5.08 x10E6/uL (ref 3.77–5.28)
RDW: 12.8 % (ref 11.7–15.4)
WBC: 6.5 10*3/uL (ref 3.4–10.8)

## 2022-04-14 LAB — LIPID PANEL
Chol/HDL Ratio: 2.6 ratio (ref 0.0–4.4)
Cholesterol, Total: 148 mg/dL (ref 100–199)
HDL: 57 mg/dL (ref >39)
LDL Chol Calc (NIH): 55 mg/dL (ref 0–99)
Triglycerides: 226 mg/dL — ABNORMAL HIGH (ref 0–149)
VLDL Cholesterol Cal: 36 mg/dL (ref 5–40)

## 2022-05-24 ENCOUNTER — Other Ambulatory Visit: Payer: Self-pay | Admitting: Family Medicine

## 2022-05-24 DIAGNOSIS — E119 Type 2 diabetes mellitus without complications: Secondary | ICD-10-CM

## 2022-08-31 ENCOUNTER — Ambulatory Visit: Payer: Managed Care, Other (non HMO) | Admitting: Family Medicine

## 2022-08-31 ENCOUNTER — Encounter: Payer: Self-pay | Admitting: Family Medicine

## 2022-08-31 VITALS — BP 124/87 | HR 92 | Temp 98.2°F | Resp 12 | Ht 65.0 in | Wt 170.7 lb

## 2022-08-31 DIAGNOSIS — E119 Type 2 diabetes mellitus without complications: Secondary | ICD-10-CM

## 2022-08-31 DIAGNOSIS — E781 Pure hyperglyceridemia: Secondary | ICD-10-CM

## 2022-08-31 NOTE — Progress Notes (Signed)
Established patient visit   Patient: Nancy Wood   DOB: October 12, 1970   52 y.o. Female  MRN: [id number] Visit Date: 08/31/2022  Today's healthcare provider: Mila Merry, MD   Chief Complaint  Patient presents with   Diabetes   Subjective    Discussed the use of AI scribe software for clinical note transcription with the patient, who gave verbal consent to proceed.  History of Present Illness   The patient, with a history of diabetes and hyperlipidemia, has been managing her conditions with metformin and pravastatin. She reports no adverse effects from these medications. Her blood glucose levels, when last checked, ranged between 120 and 180, with no episodes of hypoglycemia. She has not had any recent eye examinations but acknowledges the need to find a new eye doctor.  The patient denies experiencing any chest pains, heart flutters, or shortness of breath. She has been maintaining an active lifestyle, primarily through yard work. There have been no complaints of numbness or tingling in the fingers or toes.  The patient's last labs in February found HbA1c was 7.7, and her LDL cholesterol was 55. She has been adhering to her medication regimen and reports no need for any prescription refills at this time. She has been fasting, consuming only water, prior to her current visit.       Medications: Outpatient Medications Prior to Visit  Medication Sig   CONTOUR NEXT TEST test strip USE TO CHECK BLOOD SUGAR DAILY   metFORMIN (GLUCOPHAGE-XR) 500 MG 24 hr tablet TAKE 2 TABLETS(1000 MG) BY MOUTH TWICE DAILY WITH A MEAL   Microlet Lancets MISC USE TO CHECK BLOOD SUGAR DAILY   pravastatin (PRAVACHOL) 40 MG tablet TAKE 1 TABLET(40 MG) BY MOUTH DAILY   No facility-administered medications prior to visit.   Review of Systems  Constitutional:  Negative for appetite change, chills, fatigue and fever.  Respiratory:  Negative for chest tightness and shortness of breath.    Cardiovascular:  Negative for chest pain and palpitations.  Gastrointestinal:  Negative for abdominal pain, nausea and vomiting.  Neurological:  Negative for dizziness and weakness.       Objective    BP 124/87 (BP Location: Left Arm, Patient Position: Sitting, Cuff Size: Normal)   Pulse 92   Temp 98.2 F (36.8 C) (Temporal)   Resp 12   Ht 5\' 5"  (1.651 m)   Wt 170 lb 11.2 oz (77.4 kg)   LMP 06/01/2014   SpO2 98%   BMI 28.41 kg/m    Physical Exam  Physical Exam        No results found for any visits on 08/31/22.  Assessment & Plan     Assessment and Plan    Type 2 Diabetes Mellitus: Patient reports blood glucose levels between 120-180. Last A1c was 7.7, above the target of less than 7. No symptoms of hypoglycemia or peripheral neuropathy reported. Patient is on Metformin 1000mg  twice daily. -Order labs including A1c and urinalysis for diabetic kidney check. -Encourage patient to continue monitoring blood glucose levels.  Hyperlipidemia: Patient is on Pravastatin. Last LDL was 55, within target range. -Order lipid panel with other labs.  Ophthalmology follow-up: Patient has not seen an eye doctor in the past year, which is recommended for diabetes management. Patient reports dissatisfaction with current eye doctor and is seeking a new one. -Encourage patient to find a new eye doctor and schedule an appointment.  General Health Maintenance: -Continue Metformin 1000mg  twice daily and Pravastatin as prescribed. -  Encourage physical activity as able -Schedule follow-up appointment after lab results are available.           Mila Merry, MD  [facility] [phone] (phone) [phone] (fax)  [facility]

## 2022-09-01 LAB — HEMOGLOBIN A1C
Est. average glucose Bld gHb Est-mCnc: 166 mg/dL
Hgb A1c MFr Bld: 7.4 % — ABNORMAL HIGH (ref 4.8–5.6)

## 2022-09-01 LAB — LIPID PANEL
Chol/HDL Ratio: 2.4 ratio (ref 0.0–4.4)
Cholesterol, Total: 144 mg/dL (ref 100–199)
HDL: 59 mg/dL (ref >39)
LDL Chol Calc (NIH): 52 mg/dL (ref 0–99)
Triglycerides: 203 mg/dL — ABNORMAL HIGH (ref 0–149)
VLDL Cholesterol Cal: 33 mg/dL (ref 5–40)

## 2022-09-01 LAB — MICROALBUMIN / CREATININE URINE RATIO
Creatinine, Urine: 93.1 mg/dL
Microalb/Creat Ratio: 18 mg/g creat (ref 0–29)
Microalbumin, Urine: 17 ug/mL

## 2022-09-08 ENCOUNTER — Other Ambulatory Visit: Payer: Self-pay

## 2022-09-08 DIAGNOSIS — Z122 Encounter for screening for malignant neoplasm of respiratory organs: Secondary | ICD-10-CM

## 2022-09-08 DIAGNOSIS — Z87891 Personal history of nicotine dependence: Secondary | ICD-10-CM

## 2022-09-08 DIAGNOSIS — F17210 Nicotine dependence, cigarettes, uncomplicated: Secondary | ICD-10-CM

## 2022-09-15 ENCOUNTER — Other Ambulatory Visit: Payer: Managed Care, Other (non HMO)

## 2022-09-16 ENCOUNTER — Other Ambulatory Visit: Payer: Managed Care, Other (non HMO)

## 2022-09-28 ENCOUNTER — Ambulatory Visit
Admission: RE | Admit: 2022-09-28 | Discharge: 2022-09-28 | Disposition: A | Discharge status: Home / Self Care | Payer: Managed Care, Other (non HMO) | Source: Ambulatory Visit

## 2022-09-28 DIAGNOSIS — F17210 Nicotine dependence, cigarettes, uncomplicated: Secondary | ICD-10-CM

## 2022-09-28 DIAGNOSIS — Z122 Encounter for screening for malignant neoplasm of respiratory organs: Secondary | ICD-10-CM

## 2022-09-28 DIAGNOSIS — Z87891 Personal history of nicotine dependence: Secondary | ICD-10-CM

## 2022-10-04 ENCOUNTER — Other Ambulatory Visit: Payer: Self-pay

## 2022-10-04 ENCOUNTER — Encounter: Payer: Self-pay | Admitting: Family Medicine

## 2022-10-04 DIAGNOSIS — F17210 Nicotine dependence, cigarettes, uncomplicated: Secondary | ICD-10-CM

## 2022-10-04 DIAGNOSIS — I700 Atherosclerosis of aorta: Secondary | ICD-10-CM | POA: Insufficient documentation

## 2022-10-04 DIAGNOSIS — Z87891 Personal history of nicotine dependence: Secondary | ICD-10-CM

## 2022-10-04 DIAGNOSIS — J439 Emphysema, unspecified: Secondary | ICD-10-CM | POA: Insufficient documentation

## 2022-10-04 DIAGNOSIS — Z122 Encounter for screening for malignant neoplasm of respiratory organs: Secondary | ICD-10-CM

## 2022-11-30 ENCOUNTER — Other Ambulatory Visit: Payer: Self-pay | Admitting: Family Medicine

## 2022-11-30 DIAGNOSIS — E119 Type 2 diabetes mellitus without complications: Secondary | ICD-10-CM

## 2022-11-30 NOTE — Telephone Encounter (Signed)
Medication Refill - Medication: metFORMIN (GLUCOPHAGE-XR) 500 MG 24 hr tablet   Has the patient contacted their pharmacy? Yes.     Preferred Pharmacy (with phone number or street name):  [id number] [place], [place] - [address] AT [facility] Phone: [phone]  Fax: [phone]      Has the patient been seen for an appointment in the last year OR does the patient have an upcoming appointment? Yes.    The patient called in stating she asked her pharmacy days ago to send a refills request but it is not showing. She only has 1 pill left and needs a refill as soon as possible. Please assist patient further

## 2022-12-01 MED ORDER — METFORMIN HCL ER 500 MG PO TB24
1000.0000 mg | ORAL_TABLET | Freq: Two times a day (BID) | ORAL | 0 refills | Status: DC
Start: 2022-12-01 — End: 2023-03-01

## 2022-12-01 NOTE — Telephone Encounter (Signed)
Patient requesting refills. Last labs 04/13/22. Requested Prescriptions  Pending Prescriptions Disp Refills   metFORMIN (GLUCOPHAGE-XR) 500 MG 24 hr tablet 360 tablet 0    Sig: Take 2 tablets (1,000 mg total) by mouth 2 (two) times daily with a meal.     Endocrinology:  Diabetes - Biguanides Failed - 11/30/2022  5:25 PM      Failed - B12 Level in normal range and within 720 days    No results found for: "VITAMINB12"       Failed - CBC within normal limits and completed in the last 12 months    WBC  Date Value Ref Range Status  04/13/2022 6.5 3.4 - 10.8 x10E3/uL Final  01/15/2014 8.1 4.0 - 10.5 K/uL Final   RBC  Date Value Ref Range Status  04/13/2022 5.08 3.77 - 5.28 x10E6/uL Final  01/15/2014 3.91 3.87 - 5.11 MIL/uL Final   Hemoglobin  Date Value Ref Range Status  04/13/2022 14.3 11.1 - 15.9 g/dL Final   Hematocrit  Date Value Ref Range Status  04/13/2022 42.4 34.0 - 46.6 % Final   MCHC  Date Value Ref Range Status  04/13/2022 33.7 31.5 - 35.7 g/dL Final  08/65/7846 96.2 30.0 - 36.0 g/dL Final   [facility]  Date Value Ref Range Status  04/13/2022 28.1 26.6 - 33.0 pg Final  01/15/2014 27.9 26.0 - 34.0 pg Final   MCV  Date Value Ref Range Status  04/13/2022 84 79 - 97 fL Final   No results found for: "PLTCOUNTKUC", "LABPLAT", "POCPLA" RDW  Date Value Ref Range Status  04/13/2022 12.8 11.7 - 15.4 % Final         Passed - Cr in normal range and within 360 days    Creatinine, Ser  Date Value Ref Range Status  04/13/2022 0.83 0.57 - 1.00 mg/dL Final         Passed - HBA1C is between 0 and 7.9 and within 180 days    Hgb A1c MFr Bld  Date Value Ref Range Status  08/31/2022 7.4 (H) 4.8 - 5.6 % Final    Comment:             Prediabetes: 5.7 - 6.4          Diabetes: >6.4          Glycemic control for adults with diabetes: <7.0          Passed - eGFR in normal range and within 360 days    GFR calc Af Amer  Date Value Ref Range Status  04/08/2019 104 >59 mL/min/1.73  Final   GFR calc non Af Amer  Date Value Ref Range Status  04/08/2019 90 >59 mL/min/1.73 Final   eGFR  Date Value Ref Range Status  04/13/2022 84 >59 mL/min/1.73 Final         Passed - Valid encounter within last 6 months    Recent Outpatient Visits           3 months ago Diabetes mellitus without complication (HCC)    [place] [facility] Malva Limes, MD   7 months ago Diabetes mellitus without complication [facility])   [place] [facility] Malva Limes, MD   1 year ago Diabetes mellitus without complication [facility])   [place] [facility] Malva Limes, MD   1 year ago Diabetes mellitus without complication [facility])   [place] [facility] Malva Limes, MD   2 years ago Type 2 diabetes mellitus  without complication, without long-term current use of insulin [facility])   [place] [facility] Sherrie Mustache, Demetrios Isaacs, MD

## 2022-12-03 ENCOUNTER — Other Ambulatory Visit: Payer: Self-pay | Admitting: Family Medicine

## 2022-12-03 DIAGNOSIS — E119 Type 2 diabetes mellitus without complications: Secondary | ICD-10-CM

## 2022-12-05 NOTE — Telephone Encounter (Signed)
Request too soon for refill.  Requested Prescriptions  Pending Prescriptions Disp Refills   metFORMIN (GLUCOPHAGE-XR) 500 MG 24 hr tablet [Pharmacy Med Name: METFORMIN ER 500MG  24HR TABS] 360 tablet 0    Sig: TAKE 2 TABLETS(1000 MG) BY MOUTH TWICE DAILY WITH A MEAL     Endocrinology:  Diabetes - Biguanides Failed - 12/03/2022  1:41 PM      Failed - B12 Level in normal range and within 720 days    No results found for: "VITAMINB12"       Failed - CBC within normal limits and completed in the last 12 months    WBC  Date Value Ref Range Status  04/13/2022 6.5 3.4 - 10.8 x10E3/uL Final  01/15/2014 8.1 4.0 - 10.5 K/uL Final   RBC  Date Value Ref Range Status  04/13/2022 5.08 3.77 - 5.28 x10E6/uL Final  01/15/2014 3.91 3.87 - 5.11 MIL/uL Final   Hemoglobin  Date Value Ref Range Status  04/13/2022 14.3 11.1 - 15.9 g/dL Final   Hematocrit  Date Value Ref Range Status  04/13/2022 42.4 34.0 - 46.6 % Final   MCHC  Date Value Ref Range Status  04/13/2022 33.7 31.5 - 35.7 g/dL Final  96/29/5284 13.2 30.0 - 36.0 g/dL Final   [facility]  Date Value Ref Range Status  04/13/2022 28.1 26.6 - 33.0 pg Final  01/15/2014 27.9 26.0 - 34.0 pg Final   MCV  Date Value Ref Range Status  04/13/2022 84 79 - 97 fL Final   No results found for: "PLTCOUNTKUC", "LABPLAT", "POCPLA" RDW  Date Value Ref Range Status  04/13/2022 12.8 11.7 - 15.4 % Final         Passed - Cr in normal range and within 360 days    Creatinine, Ser  Date Value Ref Range Status  04/13/2022 0.83 0.57 - 1.00 mg/dL Final         Passed - HBA1C is between 0 and 7.9 and within 180 days    Hgb A1c MFr Bld  Date Value Ref Range Status  08/31/2022 7.4 (H) 4.8 - 5.6 % Final    Comment:             Prediabetes: 5.7 - 6.4          Diabetes: >6.4          Glycemic control for adults with diabetes: <7.0          Passed - eGFR in normal range and within 360 days    GFR calc Af Amer  Date Value Ref Range Status  04/08/2019  104 >59 mL/min/1.73 Final   GFR calc non Af Amer  Date Value Ref Range Status  04/08/2019 90 >59 mL/min/1.73 Final   eGFR  Date Value Ref Range Status  04/13/2022 84 >59 mL/min/1.73 Final         Passed - Valid encounter within last 6 months    Recent Outpatient Visits           3 months ago Diabetes mellitus without complication (HCC)   [place] [facility] Malva Limes, MD   7 months ago Diabetes mellitus without complication [facility])   [place] [facility] Malva Limes, MD   1 year ago Diabetes mellitus without complication [facility])   [place] [facility] Malva Limes, MD   1 year ago Diabetes mellitus without complication [facility])   [place] [facility] Malva Limes, MD   2 years ago  Type 2 diabetes mellitus without complication, without long-term current use of insulin [facility])   [place] [facility] Sherrie Mustache, Demetrios Isaacs, MD

## 2023-01-06 ENCOUNTER — Other Ambulatory Visit: Payer: Self-pay | Admitting: Family Medicine

## 2023-01-06 DIAGNOSIS — E781 Pure hyperglyceridemia: Secondary | ICD-10-CM

## 2023-02-02 ENCOUNTER — Other Ambulatory Visit: Payer: Self-pay | Admitting: Family Medicine

## 2023-02-02 DIAGNOSIS — E119 Type 2 diabetes mellitus without complications: Secondary | ICD-10-CM

## 2023-02-02 NOTE — Telephone Encounter (Signed)
Requested Prescriptions  Refused Prescriptions Disp Refills   metFORMIN (GLUCOPHAGE-XR) 500 MG 24 hr tablet [Pharmacy Med Name: METFORMIN ER 500MG  24HR TABS] 360 tablet 0    Sig: TAKE 2 TABLETS(1000 MG) BY MOUTH TWICE DAILY WITH A MEAL     Endocrinology:  Diabetes - Biguanides Failed - 02/02/2023 10:49 AM      Failed - B12 Level in normal range and within 720 days    No results found for: "VITAMINB12"       Failed - CBC within normal limits and completed in the last 12 months    WBC  Date Value Ref Range Status  04/13/2022 6.5 3.4 - 10.8 x10E3/uL Final  01/15/2014 8.1 4.0 - 10.5 K/uL Final   RBC  Date Value Ref Range Status  04/13/2022 5.08 3.77 - 5.28 x10E6/uL Final  01/15/2014 3.91 3.87 - 5.11 MIL/uL Final   Hemoglobin  Date Value Ref Range Status  04/13/2022 14.3 11.1 - 15.9 g/dL Final   Hematocrit  Date Value Ref Range Status  04/13/2022 42.4 34.0 - 46.6 % Final   MCHC  Date Value Ref Range Status  04/13/2022 33.7 31.5 - 35.7 g/dL Final  84/13/2440 10.2 30.0 - 36.0 g/dL Final   [facility]  Date Value Ref Range Status  04/13/2022 28.1 26.6 - 33.0 pg Final  01/15/2014 27.9 26.0 - 34.0 pg Final   MCV  Date Value Ref Range Status  04/13/2022 84 79 - 97 fL Final   No results found for: "PLTCOUNTKUC", "LABPLAT", "POCPLA" RDW  Date Value Ref Range Status  04/13/2022 12.8 11.7 - 15.4 % Final         Passed - Cr in normal range and within 360 days    Creatinine, Ser  Date Value Ref Range Status  04/13/2022 0.83 0.57 - 1.00 mg/dL Final         Passed - HBA1C is between 0 and 7.9 and within 180 days    Hgb A1c MFr Bld  Date Value Ref Range Status  08/31/2022 7.4 (H) 4.8 - 5.6 % Final    Comment:             Prediabetes: 5.7 - 6.4          Diabetes: >6.4          Glycemic control for adults with diabetes: <7.0          Passed - eGFR in normal range and within 360 days    GFR calc Af Amer  Date Value Ref Range Status  04/08/2019 104 >59 mL/min/1.73 Final   GFR  calc non Af Amer  Date Value Ref Range Status  04/08/2019 90 >59 mL/min/1.73 Final   eGFR  Date Value Ref Range Status  04/13/2022 84 >59 mL/min/1.73 Final         Passed - Valid encounter within last 6 months    Recent Outpatient Visits           5 months ago Diabetes mellitus without complication (HCC)   [place] [facility] Malva Limes, MD   9 months ago Diabetes mellitus without complication [facility])   [place] [facility] Malva Limes, MD   1 year ago Diabetes mellitus without complication [facility])   [place] [facility] Malva Limes, MD   1 year ago Diabetes mellitus without complication [facility])   [place] [facility] Malva Limes, MD   2 years ago Type 2 diabetes mellitus without complication, without  long-term current use of insulin [facility])   [place] [facility] Sherrie Mustache, Demetrios Isaacs, MD

## 2023-02-27 ENCOUNTER — Other Ambulatory Visit: Payer: Self-pay | Admitting: Family Medicine

## 2023-02-27 DIAGNOSIS — E119 Type 2 diabetes mellitus without complications: Secondary | ICD-10-CM

## 2023-03-01 ENCOUNTER — Telehealth: Payer: Self-pay | Admitting: Family Medicine

## 2023-03-01 ENCOUNTER — Other Ambulatory Visit: Payer: Self-pay | Admitting: Family Medicine

## 2023-03-01 ENCOUNTER — Telehealth: Payer: Self-pay

## 2023-03-01 DIAGNOSIS — Z1231 Encounter for screening mammogram for malignant neoplasm of breast: Secondary | ICD-10-CM

## 2023-03-01 DIAGNOSIS — E119 Type 2 diabetes mellitus without complications: Secondary | ICD-10-CM

## 2023-03-01 MED ORDER — METFORMIN HCL ER 500 MG PO TB24
1000.0000 mg | ORAL_TABLET | Freq: Two times a day (BID) | ORAL | 0 refills | Status: DC
Start: 2023-03-01 — End: 2023-05-03

## 2023-03-01 NOTE — Telephone Encounter (Signed)
 Copied from CRM [id number]. Topic: Appointment Scheduling - Scheduling Inquiry for Clinic >> Mar 01, 2023 10:32 AM Selinda RAMAN wrote: Reason for CRM: The patient called in to schedule an appt and needs her refill called in today as it was denied due to not having an appt. She will be out of her medicine starting tomorrow. The request was already sent in and is awaiting approval from  [facility] #[id number], [place] - [address] AT [facility]  Phone: [phone] Fax: [phone]   Please assist patient further

## 2023-03-01 NOTE — Telephone Encounter (Signed)
 Patient called to f/u on her refill request, she will take her last dosage of metFORMIN (GLUCOPHAGE-XR) 500 MG 24 hr tablet

## 2023-03-01 NOTE — Telephone Encounter (Signed)
 Prescription for Metformin sent in

## 2023-03-03 ENCOUNTER — Telehealth (INDEPENDENT_AMBULATORY_CARE_PROVIDER_SITE_OTHER): Payer: Managed Care, Other (non HMO) | Admitting: Family Medicine

## 2023-03-03 DIAGNOSIS — Z7984 Long term (current) use of oral hypoglycemic drugs: Secondary | ICD-10-CM

## 2023-03-03 DIAGNOSIS — E781 Pure hyperglyceridemia: Secondary | ICD-10-CM

## 2023-03-03 DIAGNOSIS — R8290 Unspecified abnormal findings in urine: Secondary | ICD-10-CM | POA: Diagnosis not present

## 2023-03-03 DIAGNOSIS — E785 Hyperlipidemia, unspecified: Secondary | ICD-10-CM

## 2023-03-03 DIAGNOSIS — E119 Type 2 diabetes mellitus without complications: Secondary | ICD-10-CM | POA: Diagnosis not present

## 2023-03-03 NOTE — Patient Instructions (Signed)
Please review the attached list of medications and notify my office if there are any errors.    Please go to the lab draw station in [address] on the second floor of [facility] . Normal hours are 8:00am to 11:30am and 1:00pm to 4:00pm Monday through Friday

## 2023-03-04 NOTE — Progress Notes (Signed)
 MyChart Video Visit    Virtual Visit via Video Note   This format is felt to be most appropriate for this patient at this time. Physical exam was limited by quality of the video and audio technology used for the visit.   Patient location: home Provider location: [facility]  I discussed the limitations of evaluation and management by telemedicine and the availability of in person appointments. The patient expressed understanding and agreed to proceed.  Patient: Nancy Wood   DOB: [birth year]   53 y.o. Female  MRN: [id number] Visit Date: 03/03/2023  Today's healthcare provider: Nancyann Perry, MD   Chief Complaint  Patient presents with   Diabetes   Subjective     History of Present Illness   The patient, with a history of diabetes and hyperlipidemia, reports persistent high morning blood glucose levels, which decrease after eating. The morning readings are typically around 245 mg/dL, decreasing to between 120 and 160 mg/dL postprandially. The patient is currently on metformin  twice daily and pravastatin  once daily, with no reported side effects. She generally adheres to a diet low in sweets and starchy foods, favoring whole grains. However, the patient acknowledges a lack of regular exercise and expresses a desire to establish a home workout regimen. The patient's weight has remained steady at 168 lbs.    She has also noticed a change in the odor of her morning urine over the past two months. The odor is described as strong but not similar to ammonia, and it only occurs with the first morning void. The patient expresses concern about this change and requests a urinalysis to investigate further.       Medications: Outpatient Medications Prior to Visit  Medication Sig   CONTOUR NEXT TEST test strip USE TO CHECK BLOOD SUGAR DAILY   metFORMIN  (GLUCOPHAGE -XR) 500 MG 24 hr tablet Take 2 tablets (1,000 mg total) by mouth 2 (two) times daily with a meal.   Microlet Lancets MISC USE TO  CHECK BLOOD SUGAR DAILY   pravastatin  (PRAVACHOL ) 40 MG tablet TAKE 1 TABLET(40 MG) BY MOUTH DAILY   No facility-administered medications prior to visit.    Objective    LMP 06/01/2014   Physical Exam  General appearance: Well developed, well nourished female, cooperative and in no acute distress Head: Normocephalic, without obvious abnormality, atraumatic Respiratory: Respirations even and unlabored, normal respiratory rate Extremities: All extremities are intact.  Skin: Skin color, texture, turgor normal. No rashes seen  Psych: Appropriate mood and affect. Neurologic: Mental status: Alert, oriented to person, place, and time, thought content appropriate.      Assessment & Plan       Type 2 Diabetes Mellitus Morning hyperglycemia with postprandial normalization. Adherence to Metformin  500mg  BID. Discussed the phenomenon of dawn phenomenon and the potential role of a high protein snack before bed. -Continue Metformin  500mg  BID. -Consider high protein snack before bed. -Check HbA1c on 03/13/2023.  Hyperlipidemia Adherence to Pravastatin . No reported side effects. -Continue Pravastatin  daily. -Come into lab for fasting lipids, cbc, and met c  -Order urinalysis due to reported change in urine odor.        I discussed the assessment and treatment plan with the patient. The patient was provided an opportunity to ask questions and all were answered. The patient agreed with the plan and demonstrated an understanding of the instructions.   The patient was advised to call back or seek an in-person evaluation if the symptoms worsen or if the condition fails to  improve as anticipated.  I provided 12 minutes of non-face-to-face time during this encounter.   Nancyann Perry, MD [facility] [phone] (phone) [phone] (fax)  [facility]

## 2023-03-15 ENCOUNTER — Other Ambulatory Visit: Payer: Self-pay | Admitting: Family Medicine

## 2023-03-15 ENCOUNTER — Encounter: Payer: Self-pay | Admitting: Family Medicine

## 2023-03-15 DIAGNOSIS — N3000 Acute cystitis without hematuria: Secondary | ICD-10-CM

## 2023-03-15 LAB — HEMOGLOBIN A1C
Est. average glucose Bld gHb Est-mCnc: 177 mg/dL
Hgb A1c MFr Bld: 7.8 % — ABNORMAL HIGH (ref 4.8–5.6)

## 2023-03-15 LAB — COMPREHENSIVE METABOLIC PANEL
ALT: 27 [IU]/L (ref 0–32)
AST: 18 [IU]/L (ref 0–40)
Albumin: 4.8 g/dL (ref 3.8–4.9)
Alkaline Phosphatase: 60 [IU]/L (ref 44–121)
BUN/Creatinine Ratio: 11 (ref 9–23)
BUN: 9 mg/dL (ref 6–24)
Bilirubin Total: <0.2 mg/dL (ref 0.0–1.2)
CO2: 21 mmol/L (ref 20–29)
Calcium: 10.3 mg/dL — ABNORMAL HIGH (ref 8.7–10.2)
Chloride: 103 mmol/L (ref 96–106)
Creatinine, Ser: 0.79 mg/dL (ref 0.57–1.00)
Globulin, Total: 2.2 g/dL (ref 1.5–4.5)
Glucose: 236 mg/dL — ABNORMAL HIGH (ref 70–99)
Potassium: 4.7 mmol/L (ref 3.5–5.2)
Sodium: 142 mmol/L (ref 134–144)
Total Protein: 7 g/dL (ref 6.0–8.5)
eGFR: 89 mL/min/{1.73_m2} (ref >59)

## 2023-03-15 LAB — CBC
Hematocrit: 43.4 % (ref 34.0–46.6)
Hemoglobin: 13.6 g/dL (ref 11.1–15.9)
MCH: 27.1 pg (ref 26.6–33.0)
MCHC: 31.3 g/dL — ABNORMAL LOW (ref 31.5–35.7)
MCV: 87 fL (ref 79–97)
Platelets: 260 10*3/uL (ref 150–450)
RBC: 5.02 x10E6/uL (ref 3.77–5.28)
RDW: 12.4 % (ref 11.7–15.4)
WBC: 7.2 10*3/uL (ref 3.4–10.8)

## 2023-03-15 LAB — URINALYSIS, ROUTINE W REFLEX MICROSCOPIC
Bilirubin, UA: NEGATIVE
Ketones, UA: NEGATIVE
Nitrite, UA: POSITIVE — AB
Protein,UA: NEGATIVE
RBC, UA: NEGATIVE
Specific Gravity, UA: 1.021 (ref 1.005–1.030)
Urobilinogen, Ur: 0.2 mg/dL (ref 0.2–1.0)
pH, UA: 5.5 (ref 5.0–7.5)

## 2023-03-15 LAB — MICROSCOPIC EXAMINATION
Casts: NONE SEEN /[LPF]
RBC, Urine: NONE SEEN /[HPF] (ref 0–2)
WBC, UA: >30 /[HPF] — AB (ref 0–5)

## 2023-03-15 LAB — LIPID PANEL
Chol/HDL Ratio: 2.4 {ratio} (ref 0.0–4.4)
Cholesterol, Total: 144 mg/dL (ref 100–199)
HDL: 59 mg/dL (ref >39)
LDL Chol Calc (NIH): 41 mg/dL (ref 0–99)
Triglycerides: 293 mg/dL — ABNORMAL HIGH (ref 0–149)
VLDL Cholesterol Cal: 44 mg/dL — ABNORMAL HIGH (ref 5–40)

## 2023-03-15 MED ORDER — CEFDINIR 300 MG PO CAPS
300.0000 mg | ORAL_CAPSULE | Freq: Two times a day (BID) | ORAL | 0 refills | Status: AC
Start: 2023-03-15 — End: 2023-03-22

## 2023-04-03 ENCOUNTER — Ambulatory Visit
Admission: RE | Admit: 2023-04-03 | Discharge: 2023-04-03 | Disposition: A | Discharge status: Home / Self Care | Payer: Managed Care, Other (non HMO) | Source: Ambulatory Visit | Attending: Family Medicine | Admitting: Family Medicine

## 2023-04-03 DIAGNOSIS — Z1231 Encounter for screening mammogram for malignant neoplasm of breast: Secondary | ICD-10-CM

## 2023-05-03 ENCOUNTER — Other Ambulatory Visit: Payer: Self-pay | Admitting: Family Medicine

## 2023-05-03 DIAGNOSIS — E119 Type 2 diabetes mellitus without complications: Secondary | ICD-10-CM

## 2023-09-13 ENCOUNTER — Telehealth: Payer: Self-pay

## 2023-09-13 NOTE — Telephone Encounter (Signed)
 Copied from CRM [id number]. Topic: Clinical - Medical Advice >> Sep 13, 2023 11:41 AM Delon DASEN wrote: Reason for CRM: Patient quit smoking and would like something called in to help- [phone]

## 2023-09-13 NOTE — Telephone Encounter (Signed)
 Last office visit was 03/03/23 video visit. Please advise on request.

## 2023-09-15 NOTE — Telephone Encounter (Signed)
 She has been prescribed Chantix and Zyban  in the past. Which does she prefer

## 2023-09-18 NOTE — Telephone Encounter (Signed)
 Contacted patient- she states she is actually doing well and does not feel ike she needs medication at this time.

## 2023-09-22 ENCOUNTER — Encounter: Payer: Self-pay | Admitting: Family Medicine

## 2023-09-27 NOTE — Telephone Encounter (Signed)
 That's fine, he can be scheduled with me since he is family member of established pt.

## 2023-09-29 ENCOUNTER — Ambulatory Visit
Admission: RE | Admit: 2023-09-29 | Discharge: 2023-09-29 | Disposition: A | Discharge status: Home / Self Care | Source: Ambulatory Visit | Attending: Acute Care | Admitting: Acute Care

## 2023-09-29 DIAGNOSIS — Z87891 Personal history of nicotine dependence: Secondary | ICD-10-CM

## 2023-09-29 DIAGNOSIS — F17210 Nicotine dependence, cigarettes, uncomplicated: Secondary | ICD-10-CM

## 2023-09-29 DIAGNOSIS — Z122 Encounter for screening for malignant neoplasm of respiratory organs: Secondary | ICD-10-CM

## 2023-10-16 ENCOUNTER — Other Ambulatory Visit: Payer: Self-pay

## 2023-10-16 DIAGNOSIS — Z87891 Personal history of nicotine dependence: Secondary | ICD-10-CM

## 2023-10-16 DIAGNOSIS — F17210 Nicotine dependence, cigarettes, uncomplicated: Secondary | ICD-10-CM

## 2023-10-16 DIAGNOSIS — Z122 Encounter for screening for malignant neoplasm of respiratory organs: Secondary | ICD-10-CM

## 2023-10-16 LAB — LIPID PANEL
Cholesterol: 159 (ref 0–200)
HDL: 59 (ref 35–70)
LDL Cholesterol: 49
Triglycerides: 242 — AB (ref 40–160)

## 2023-10-16 LAB — HEMOGLOBIN A1C: Hemoglobin A1C: 7.8

## 2023-10-18 ENCOUNTER — Ambulatory Visit: Admitting: Family Medicine

## 2023-10-18 VITALS — BP 122/85 | HR 85 | Temp 98.5°F | Ht 65.0 in | Wt 178.6 lb

## 2023-10-18 DIAGNOSIS — Z7984 Long term (current) use of oral hypoglycemic drugs: Secondary | ICD-10-CM | POA: Diagnosis not present

## 2023-10-18 DIAGNOSIS — E781 Pure hyperglyceridemia: Secondary | ICD-10-CM | POA: Diagnosis not present

## 2023-10-18 DIAGNOSIS — E119 Type 2 diabetes mellitus without complications: Secondary | ICD-10-CM | POA: Diagnosis not present

## 2023-10-18 MED ORDER — TIRZEPATIDE 2.5 MG/0.5ML ~~LOC~~ SOAJ: 2.5000 mg | SUBCUTANEOUS | 0 refills | Status: DC

## 2023-10-18 NOTE — Patient Instructions (Signed)
 SABRA  Please review the attached list of medications and notify my office if there are any errors.   . Please bring all of your medications to every appointment so we can make sure that our medication list is the same as yours.

## 2023-10-18 NOTE — Progress Notes (Signed)
 Established patient visit   Patient: Nancy Wood   DOB: 22-Jun-1970   53 y.o. Female  MRN: [id number] Visit Date: 10/18/2023  Today's healthcare provider: Nancyann Perry, MD   Chief Complaint  Patient presents with   Medical Management of Chronic Issues    Pneumococcal Vaccine- declined  HIV Screening and Hepatitis C Screening- declined    Diabetes     Nancy Wood is a 53 y.o. female who presents for follow up of diabetes.. Current symptoms include: none. Patient denies any symptoms. Reports she does monitor sugar at home and it is ranges 115-160, occasionally shoots up to 180.    Diabetic Eye Exam- not recently  Also stopped smoking 6 weeks ago.   Subjective    Discussed the use of AI scribe software for clinical note transcription with the patient, who gave verbal consent to proceed.  History of Present Illness   Nancy Wood is a 53 year old female with type 2 diabetes who presents for management of diabetes.   She quit smoking six weeks ago, having previously smoked a pack to a pack and a half per day. She quit 'cold [place]' and has not smoked since. She noticed smoking was affecting her lungs, particularly over the past year, motivating her to quit due to its negative health impact. She has gained some weight since quitting but is focused on managing this.  She has type 2 diabetes with a recent biometric screening showing a non-fasting blood sugar level of 113 mg/dL and an J8r of 2.1%. Her LDL cholesterol was 49 mg/dL. At home, her blood sugar readings range from 115 mg/dL to 819 mg/dL, with higher readings in the morning. She is currently taking metformin , two tablets twice a day, and pravastatin . She wants to lower her A1c and is considering options to manage her weight and blood sugar levels.  She experiences tightness and swelling in both hands for about a year, with symptoms worsening at night. Her hands feel tight and sometimes fall asleep  during the night. She drinks plenty of water and is unsure if these symptoms are related to her diabetes or another cause. She had a recent CT lung scan; she recalls that mild calcification was noted in a previous scan.     Lab Results  Component Value Date   HGBA1C 7.8 10/16/2023   HGBA1C 7.8 (H) 03/14/2023   HGBA1C 7.4 (H) 08/31/2022   Lab Results  Component Value Date   NA 142 03/14/2023   CL 103 03/14/2023   K 4.7 03/14/2023   CO2 21 03/14/2023   BUN 9 03/14/2023   CREATININE 0.79 03/14/2023   EGFR 89 03/14/2023   CALCIUM  10.3 (H) 03/14/2023   ALBUMIN 4.8 03/14/2023   GLUCOSE 236 (H) 03/14/2023   Lab Results  Component Value Date   CHOL 159 10/16/2023   HDL 59 10/16/2023   LDLCALC 49 10/16/2023   TRIG 242 (A) 10/16/2023   CHOLHDL 2.4 03/14/2023     Medications: Outpatient Medications Prior to Visit  Medication Sig   CONTOUR NEXT TEST test strip USE TO CHECK BLOOD SUGAR DAILY   metFORMIN  (GLUCOPHAGE -XR) 500 MG 24 hr tablet TAKE 2 TABLETS(1000 MG) BY MOUTH TWICE DAILY WITH A MEAL   Microlet Lancets MISC USE TO CHECK BLOOD SUGAR DAILY   pravastatin  (PRAVACHOL ) 40 MG tablet TAKE 1 TABLET(40 MG) BY MOUTH DAILY   No facility-administered medications prior to visit.   Review of Systems  Objective    BP 122/85 (BP Location: Left Arm, Patient Position: Sitting, Cuff Size: Normal)   Pulse 85   Temp 98.5 F (36.9 C) (Oral)   Ht 5' 5 (1.651 m)   Wt 178 lb 9.6 oz (81 kg)   LMP 06/01/2014   SpO2 99%   BMI 29.72 kg/m   Physical Exam   General appearance: Well developed, well nourished female, cooperative and in no acute distress Head: Normocephalic, without obvious abnormality, atraumatic Respiratory: Respirations even and unlabored, normal respiratory rate Extremities: All extremities are intact.  Skin: Skin color, texture, turgor normal. No rashes seen  Psych: Appropriate mood and affect. Neurologic: Mental status: Alert, oriented to person, place, and  time, thought content appropriate.     Assessment & Plan         Type 2 diabetes mellitus Type 2 diabetes with A1c of 7.8, blood sugar 115-180, managed with metformin . Discussed adding Mounjaro  for better control and weight loss, preferred over Ozempic due to fewer side effects. Explained Mounjaro 's potential side effects and hormonal regulation benefits. Patient interested in Mounjaro ; insurance verification needed. - Continue metformin  as prescribed. - Prescribe Mounjaro  2.5 mg for first month, then 5 mg maintenance. - Monitor for side effects, contact provider if issues arise. - Follow up in 3-4 months to assess response.  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 Discussed weight gain post-cessation and its impact on blood sugar. Emphasized health benefits of quitting smoking.  Bilateral hand swelling and tightness Bilateral hand swelling and tightness for a year, worse at night. Likely due to repetitive motion inflammation, not cardiac or rheumatoid. - Consider ibuprofen at night for inflammation and swelling.         Nancyann Perry, MD  [facility] [phone] (phone) [phone] (fax)  [facility]

## 2023-10-30 ENCOUNTER — Other Ambulatory Visit: Payer: Self-pay | Admitting: Family Medicine

## 2023-10-30 DIAGNOSIS — E119 Type 2 diabetes mellitus without complications: Secondary | ICD-10-CM

## 2023-11-13 ENCOUNTER — Encounter: Payer: Self-pay | Admitting: Family Medicine

## 2023-11-13 DIAGNOSIS — E119 Type 2 diabetes mellitus without complications: Secondary | ICD-10-CM

## 2023-11-13 MED ORDER — TIRZEPATIDE 5 MG/0.5ML ~~LOC~~ SOAJ: 5.0000 mg | SUBCUTANEOUS | 3 refills | Status: DC

## 2023-12-12 MED ORDER — TIRZEPATIDE 7.5 MG/0.5ML ~~LOC~~ SOAJ: 7.5000 mg | SUBCUTANEOUS | 1 refills | Status: DC

## 2023-12-12 NOTE — Addendum Note (Signed)
 Addended by: GASPER GOLAS E on: 12/12/2023 01:08 PM   Modules accepted: Orders

## 2024-01-08 MED ORDER — TIRZEPATIDE 7.5 MG/0.5ML ~~LOC~~ SOAJ: 7.5000 mg | SUBCUTANEOUS | 1 refills | Status: DC

## 2024-01-08 NOTE — Addendum Note (Signed)
 Addended by: GASPER GOLAS E on: 01/08/2024 01:15 PM   Modules accepted: Orders

## 2024-01-22 ENCOUNTER — Other Ambulatory Visit: Payer: Self-pay | Admitting: Family Medicine

## 2024-01-22 DIAGNOSIS — E781 Pure hyperglyceridemia: Secondary | ICD-10-CM

## 2024-02-05 ENCOUNTER — Other Ambulatory Visit: Payer: Self-pay

## 2024-02-05 MED ORDER — TIRZEPATIDE 7.5 MG/0.5ML ~~LOC~~ SOAJ: 7.5000 mg | SUBCUTANEOUS | 1 refills | Status: DC

## 2024-02-07 ENCOUNTER — Encounter: Payer: Self-pay | Admitting: Family Medicine

## 2024-02-07 ENCOUNTER — Ambulatory Visit: Admitting: Family Medicine

## 2024-02-07 VITALS — BP 124/92 | HR 79 | Resp 16 | Ht 65.0 in | Wt 173.3 lb

## 2024-02-07 DIAGNOSIS — E119 Type 2 diabetes mellitus without complications: Secondary | ICD-10-CM | POA: Diagnosis not present

## 2024-02-07 DIAGNOSIS — Z7985 Long-term (current) use of injectable non-insulin antidiabetic drugs: Secondary | ICD-10-CM | POA: Diagnosis not present

## 2024-02-07 LAB — POCT GLYCOSYLATED HEMOGLOBIN (HGB A1C)
Est. average glucose Bld gHb Est-mCnc: 108
Hemoglobin A1C: 5.4 % (ref 4.0–5.6)

## 2024-02-07 MED ORDER — CONTOUR NEXT TEST VI STRP: ORAL_STRIP | 4 refills | Status: AC

## 2024-02-07 NOTE — Progress Notes (Signed)
 Established patient visit   Patient: Nancy Wood   DOB: [birth year]   53 y.o. Female  MRN: [id number] Visit Date: 02/07/2024  Today's healthcare provider: Nancyann Perry, MD   Chief Complaint  Patient presents with   Medical Management of Chronic Issues    T2DM Diabetic Eye Exam: Not up to date Vaccine: Declined Flu vaccine pneumococcal vaccine declined reason is had a reaction to it     Subjective    Discussed the use of AI scribe software for clinical note transcription with the patient, who gave verbal consent to proceed.  History of Present Illness   Nancy Wood is a 53 year old female with type 2 diabetes who presents for follow-up.  Her last A1c on August 25th was 7.8%, and she was started on Mounjaro  2.5 mg, which has been titrated up to 7.5 mg per week. She reports that her morning blood sugar readings since starting the 7.5 mg dose have ranged from 75 to 120 mg/dL. Her most recent A1c is 5.4%.  She experiences side effects from Mounjaro , including severe indigestion, sulfur burps, and skin sensitivity. These side effects have decreased in frequency and severity over time, with symptoms now lasting only a day after her weekly dose. She is not ready to increase the dose further due to these side effects.  She is currently taking metformin , two pills twice a day, and has noticed that Mounjaro  has reduced her cravings for sugar, though she still craves salty foods.  She does not have an eye doctor and has not had a GYN follow-up since 2020, as her previous doctor retired.     Lab Results  Component Value Date   HGBA1C 5.4 02/07/2024   HGBA1C 7.8 10/16/2023   HGBA1C 7.8 (H) 03/14/2023   Lab Results  Component Value Date   NA 142 03/14/2023   K 4.7 03/14/2023   CREATININE 0.79 03/14/2023   EGFR 89 03/14/2023   GLUCOSE 236 (H) 03/14/2023   Wt Readings from Last 3 Encounters:  02/07/24 173 lb 4.8 oz (78.6 kg)  10/18/23 178 lb 9.6 oz (81 kg)   08/31/22 170 lb 11.2 oz (77.4 kg)     Medications: Show/hide medication list[1] Review of Systems     Objective    BP (!) 124/92 (BP Location: Left Arm, Patient Position: Sitting, Cuff Size: Normal)   Pulse 79   Resp 16   Ht 5' 5 (1.651 m)   Wt 173 lb 4.8 oz (78.6 kg)   LMP 06/01/2014   SpO2 98%   BMI 28.84 kg/m   Physical Exam   General appearance: Well developed, well nourished female, cooperative and in no acute distress Head: Normocephalic, without obvious abnormality, atraumatic Respiratory: Respirations even and unlabored, normal respiratory rate Extremities: All extremities are intact.  Skin: Skin color, texture, turgor normal. No rashes seen  Psych: Appropriate mood and affect. Neurologic: Mental status: Alert, oriented to person, place, and time, thought content appropriate.   Results for orders placed or performed in visit on 02/07/24  POCT glycosylated hemoglobin (Hb A1C)  Result Value Ref Range   Hemoglobin A1C 5.4 4.0 - 5.6 %   Est. average glucose Bld gHb Est-mCnc 108     Assessment & Plan     1. Diabetes mellitus without complication (HCC) (Primary) Doing well since starting mounjaro  in August and titrating up to 7.5/week, albeit with some lingering but improving GI side effects. Continue 7.5 weekly for now. Given option to reduce  metformin  to 500 BID at her discretion which may help with GI effects.   - Urine microalbumin-creatinine with uACR Refill glucose blood (CONTOUR NEXT TEST) test strip; Use as instructed to check sugar daily for type 2 diabetes e11.9  Dispense: 100 strip; Refill: 4     Future Appointments  Date Time Provider Department Center  06/07/2024  8:20 AM Gasper Nancyann BRAVO, MD [facility] Nancyann Gasper, MD  [facility] [phone] (phone) [phone] (fax)  [facility]    [1]  Outpatient Medications Prior to Visit  Medication Sig   metFORMIN  (GLUCOPHAGE -XR) 500 MG 24 hr  tablet TAKE 2 TABLETS(1000 MG) BY MOUTH TWICE DAILY WITH A MEAL   Microlet Lancets MISC USE TO CHECK BLOOD SUGAR DAILY   pravastatin  (PRAVACHOL ) 40 MG tablet TAKE 1 TABLET(40 MG) BY MOUTH DAILY   tirzepatide  (MOUNJARO ) 7.5 MG/0.5ML Pen Inject 7.5 mg into the skin once a week.   [DISCONTINUED] CONTOUR NEXT TEST test strip USE TO CHECK BLOOD SUGAR DAILY   No facility-administered medications prior to visit.

## 2024-02-07 NOTE — Patient Instructions (Signed)
 SABRA  Please review the attached list of medications and notify my office if there are any errors.   . Please bring all of your medications to every appointment so we can make sure that our medication list is the same as yours.

## 2024-02-08 ENCOUNTER — Ambulatory Visit: Payer: Self-pay | Admitting: Family Medicine

## 2024-02-08 LAB — MICROALBUMIN / CREATININE URINE RATIO
Creatinine, Urine: 80.9 mg/dL
Microalb/Creat Ratio: 21 mg/g{creat} (ref 0–29)
Microalbumin, Urine: 17.1 ug/mL

## 2024-02-08 LAB — HEMOGLOBIN A1C

## 2024-03-04 ENCOUNTER — Other Ambulatory Visit: Payer: Self-pay | Admitting: Family Medicine

## 2024-03-04 DIAGNOSIS — Z1231 Encounter for screening mammogram for malignant neoplasm of breast: Secondary | ICD-10-CM

## 2024-03-04 DIAGNOSIS — E119 Type 2 diabetes mellitus without complications: Secondary | ICD-10-CM

## 2024-03-04 MED ORDER — TIRZEPATIDE 7.5 MG/0.5ML ~~LOC~~ SOAJ: 7.5000 mg | SUBCUTANEOUS | 1 refills | Status: AC

## 2024-04-03 ENCOUNTER — Ambulatory Visit

## 2024-06-07 ENCOUNTER — Ambulatory Visit: Admitting: Family Medicine
